# Patient Record
Sex: Female | Born: 1985 | Race: White | Hispanic: No | Marital: Married | Smoking: Never smoker
Health system: Southern US, Community
[De-identification: ages and names within clinical notes are randomized; demographics above are authoritative.]

## PROBLEM LIST (undated history)

## (undated) DIAGNOSIS — E785 Hyperlipidemia, unspecified: Secondary | ICD-10-CM

## (undated) DIAGNOSIS — T7840XA Allergy, unspecified, initial encounter: Secondary | ICD-10-CM

## (undated) DIAGNOSIS — Z9289 Personal history of other medical treatment: Secondary | ICD-10-CM

## (undated) DIAGNOSIS — E7800 Pure hypercholesterolemia, unspecified: Secondary | ICD-10-CM

## (undated) DIAGNOSIS — E669 Obesity, unspecified: Secondary | ICD-10-CM

## (undated) HISTORY — PX: OTHER SURGICAL HISTORY: SHX169

## (undated) HISTORY — PX: WISDOM TOOTH EXTRACTION: SHX21

## (undated) HISTORY — DX: Obesity, unspecified: E66.9

## (undated) HISTORY — DX: Hyperlipidemia, unspecified: E78.5

## (undated) HISTORY — DX: Personal history of other medical treatment: Z92.89

## (undated) HISTORY — DX: Pure hypercholesterolemia, unspecified: E78.00

## (undated) HISTORY — DX: Allergy, unspecified, initial encounter: T78.40XA

## (undated) SURGERY — DILATION AND CURETTAGE
Anesthesia: General

---

## 2005-09-12 ENCOUNTER — Other Ambulatory Visit: Admission: RE | Admit: 2005-09-12 | Discharge: 2005-09-12 | Payer: Self-pay | Admitting: Obstetrics and Gynecology

## 2006-02-20 ENCOUNTER — Ambulatory Visit: Payer: Self-pay | Admitting: Family Medicine

## 2006-11-09 ENCOUNTER — Other Ambulatory Visit: Admission: RE | Admit: 2006-11-09 | Discharge: 2006-11-09 | Payer: Self-pay | Admitting: Obstetrics and Gynecology

## 2007-07-01 ENCOUNTER — Ambulatory Visit: Payer: Self-pay | Admitting: Family Medicine

## 2008-03-03 ENCOUNTER — Encounter: Payer: Self-pay | Admitting: Obstetrics & Gynecology

## 2008-03-03 ENCOUNTER — Ambulatory Visit: Payer: Self-pay | Admitting: Obstetrics & Gynecology

## 2008-03-23 ENCOUNTER — Ambulatory Visit: Payer: Self-pay | Admitting: Family Medicine

## 2008-03-23 DIAGNOSIS — E669 Obesity, unspecified: Secondary | ICD-10-CM | POA: Insufficient documentation

## 2008-07-10 HISTORY — PX: EYE SURGERY: SHX253

## 2009-05-06 ENCOUNTER — Encounter: Payer: Self-pay | Admitting: Obstetrics and Gynecology

## 2009-05-06 ENCOUNTER — Ambulatory Visit: Payer: Self-pay | Admitting: Obstetrics and Gynecology

## 2009-05-06 LAB — CONVERTED CEMR LAB
Clue Cells Wet Prep HPF POC: NONE SEEN
Trich, Wet Prep: NONE SEEN

## 2010-04-07 ENCOUNTER — Ambulatory Visit: Payer: Self-pay | Admitting: Obstetrics & Gynecology

## 2010-04-21 ENCOUNTER — Encounter: Payer: Self-pay | Admitting: Obstetrics and Gynecology

## 2010-04-21 ENCOUNTER — Ambulatory Visit: Payer: Self-pay | Admitting: Obstetrics & Gynecology

## 2010-06-07 LAB — CONVERTED CEMR LAB
Cholesterol: 269 mg/dL — ABNORMAL HIGH (ref 0–200)
HDL: 49 mg/dL (ref >39)
LDL Cholesterol: 160 mg/dL — ABNORMAL HIGH (ref 0–99)
Triglycerides: 299 mg/dL — ABNORMAL HIGH (ref <150)
VLDL: 60 mg/dL — ABNORMAL HIGH (ref 0–40)

## 2010-06-10 ENCOUNTER — Ambulatory Visit: Payer: Self-pay | Admitting: Family Medicine

## 2010-06-10 DIAGNOSIS — E7800 Pure hypercholesterolemia, unspecified: Secondary | ICD-10-CM | POA: Insufficient documentation

## 2010-06-10 DIAGNOSIS — E785 Hyperlipidemia, unspecified: Secondary | ICD-10-CM

## 2010-08-09 NOTE — Assessment & Plan Note (Signed)
Summary: DISCUSS LABS FROM DR LEGGETT/CLE   Vital Signs:  Patient profile:   25 year old female Height:      63 inches Weight:      172 pounds BMI:     30.58 Temp:     98.2 degrees F oral Pulse rate:   88 / minute Pulse rhythm:   regular BP sitting:   114 / 68  (left arm) Cuff size:   regular  Vitals Entered By: Lewanda Rife LPN (June 10, 2010 8:09 AM) CC: Discuss labs from Dr Penne Lash   History of Present Illness: here to disc labs -- high cholesterol   lipids are high from draw in oct with tirg 299 and HDL of 49 and LDL 160 has had high chol in the past  diet is fair currently - not optimal   lost wt for her wedding and then gained it back now exercise is more sporatic -- tries to go to gym 2-3 times per wk cardio and strength training   eating is not optimal  beef -- 2 times per week  does eat fried foods 3-4 days per week  fast food 1-2 times per week  high fat dairly -- cheese is the number one  no shellfish -- very rare  some eggs - mayo 2 times per week  [place] breadfast one time per week        Allergies: 1)  ! * Oxycontin  Past History:  Family History: Last updated: 06/10/2010 GF with a cva ?  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  no heart disease and no high chol  no cancer in family  Review of Systems General:  Denies fatigue, fever, loss of appetite, and malaise. Eyes:  Denies blurring and eye irritation. CV:  Denies chest pain or discomfort, palpitations, and shortness of breath with exertion. Resp:  Denies cough, shortness of breath, and wheezing. GI:  Denies abdominal pain, change in bowel habits, and indigestion. GU:  Denies dysuria and urinary  frequency. MS:  Denies joint pain, joint redness, joint swelling, muscle aches, and cramps. Derm:  Denies itching, lesion(s), poor wound healing, and rash. Neuro:  Denies numbness and tingling. Psych:  Denies anxiety and depression. Endo:  Denies cold intolerance, excessive thirst, excessive urination, and heat intolerance. Heme:  Denies abnormal bruising and bleeding.  Physical Exam  General:  overweight but generally well appearing  Head:  Normocephalic and atraumatic without obvious abnormalities. No apparent alopecia or balding. Mouth:  enlarged tonsils, no exudate rapid strep test + Neck:  supple with full rom and no masses or thyromegally, no JVD or carotid bruit  Lungs:  Normal respiratory effort, chest expands symmetrically. Lungs are clear to auscultation, no crackles or wheezes. Heart:  Normal rate and regular rhythm. S1 and S2 normal without gallop, murmur, click, rub or other extra sounds. Extremities:  no CCE  Skin:  Intact without suspicious lesions or rashes Cervical Nodes:  No lymphadenopathy noted Psych:  normal affect, talkative and pleasant    Impression & Recommendations:  Problem # 1:  HYPERLIPIDEMIA (ICD-272.4) Assessment New  with obesity and hypertriglyceridemia and no fam hx rev labs in detail with pt  diet is quite poor long disc  of low sat fat diet  will also inc exercise to 5 d per week re check lab in 3 mo -- if not imp will consider statin    HDL:49 (04/21/2010)  LDL:160 (04/21/2010)  Chol:269 (04/21/2010)  Trig:299 (04/21/2010)  Complete Medication List: 1)  Junel Fe 1.5/30 1.5-30 Mg-mcg Tabs (Norethin ace-eth estrad-fe) .... As directed  Patient Instructions: 1)  you can raise your HDL (good cholesterol) by increasing exercise and eating omega 3 fatty acid supplement like fish oil or flax seed oil over the counter 2)  you can lower LDL (bad cholesterol) by limiting saturated fats in diet like red meat, fried foods, egg yolks, fatty breakfast  meats, high fat dairy products and shellfish  3)  work on diet and exercise and slow weight loss  4)  schedule fasting labs in 3 months lipid/ast/alt 272    Orders Added: 1)  Est. Patient Level III [36644]    Current Allergies (reviewed today): ! * OXYCONTIN

## 2010-11-22 NOTE — Assessment & Plan Note (Signed)
NAME:  Roberta Coleman, Roberta Coleman NO.:  [id number]   MEDICAL RECORD NO.:  [id number]          PATIENT TYPE:  POB   LOCATION:  [facility] at [facility]         FACILITY:  [facility]   PHYSICIAN:  Argentina Donovan, MD        DATE OF BIRTH:  Dec 20, 1985   DATE OF SERVICE:  05/06/2009                                  CLINIC NOTE   .  The patient is a 25 year old Caucasian female, nulligravida, recently  married within the last 4 months.  She has been on birth control pills  for sometime and has been very happy with her prescription and she takes  Junel FE 1.5/30, 3 packages at the time.  She has no medical complaints.  She works for a [employer] and has for many years.   REVIEW OF SYSTEMS:  Negative with exception of fishy occasional vaginal  odor.  Wet prep was done last time that showed too numerous to count  white cells, but no sign of any clue cells.  With her description,  however, we were going to give her a prescription for Flagyl and do a  second another wet prep.  She is due in for her annual Pap smear which  will be done also today.  Review of systems were negative.  No  significant medical history.   ALLERGIES:  Only to OXYCODONE.   PHYSICAL EXAMINATION:  VITAL SIGNS:  Blood pressure is 116/74, pulse is  92 per minute, the patient weighs 149 pounds, 5 feet 3 inches tall.  GENERAL:  Well-developed, well-nourished white female, in no acute  distress.  HEENT:  Within normal limits.  PERRLA, normocephalic.  NECK:  Supple.  Thyroid symmetrical, no masses.  BACK:  Erect.  LUNGS:  Clear to auscultation and percussion.  HEART:  No murmur, normal sinus rhythm.  BREASTS:  Symmetrical, no masses.  No nipple discharge.  No  supraclavicular or axillary nodes.  ABDOMEN:  Soft, flat, nontender.  No masses or organomegaly.  EXTREMITIES:  No edema.  No varicosities.  DTRs within normal limits.  VAGINA:  The genitalia external is normal.  BUS within normal limits.  The vagina is clean  and well rugated.  The cervix is clean, nulliparous.  The uterus is anterior with normal size, shape, and consistency.  The  adnexa is normal.  Wet prep and Pap smear were taken.  RECTAL:  Deferred.   IMPRESSION:  Normal physical examination, renewal of the patient's birth  control pills, instructions given when she decides that she is going to  start her family to get on, prenatal vitamins with folic acid.           ______________________________  Argentina Donovan, MD    PR/MEDQ  D:  05/06/2009  T:  05/07/2009  Job:  [id number]

## 2010-11-22 NOTE — Assessment & Plan Note (Signed)
NAMERICHARD, HOLZ NO.:  [id number]   MEDICAL RECORD NO.:  [id number]          PATIENT TYPE:  POB   LOCATION:  [facility] at [facility]         FACILITY:  [facility]   PHYSICIAN:  Johnella Moloney, MD        DATE OF BIRTH:  [birth year]   DATE OF SERVICE:  03/03/2008                                  CLINIC NOTE   CHIEF COMPLAINT:  Annual examination, birth control consult.   HISTORY OF PRESENT ILLNESS:  The patient is a 25 year old gravida 0 with  last menstrual period on February 01, 2008, who is here for her annual exam.  The patient also wants to discuss birth control.  She has used the OCPs  in the past in the form of Junel.  I want to discuss other forms of  birth control.  The patient is currently sexually active with her  fiance.  They have been in a monogamous relationship for 5 years and has  no other gynecologic concerns.   Past OB/GYN history, G0, menarche at age 83.  Regular menstrual cycles  with 28 days between cycles.  Her periods last for 4 days with medium  flow and mild pain associated with her periods.  No bleeding in between  periods.  The patient is currently not taking any birth control pills,  but uses condoms for birth control and for sexually transmitted  infection prevention.  She has a history of normal Pap smears.  The last  one was in October 09, 2006, and denies any sexually transmitted  infections.   PAST MEDICAL HISTORY:  None.   PAST SURGICAL HISTORY:  Wisdom teeth removal in June 2007.   MEDICATIONS:  None.   ALLERGIES:  OxyContin, which causes itching.   SOCIAL HISTORY:  The patient lives with her fiance.  She is currently  employed.  She does not smoke, drink alcohol, or use any illicit drugs.  She denies any history of sexual or physical abuse.   REVIEW OF SYSTEMS:  The patient endorses occasional vaginal odor.  She  says that since October last year, she has noticed that her vaginal  discharge has a different odor and that she was not  used to in the past.  No pruritus or any other symptoms.   PHYSICAL EXAMINATION:  VITAL SIGNS:  Blood pressure 124/77, pulse 88,  weight 168 pounds, and height 5 feet 3-1/2 inches.  IN GENERAL:  In no apparent distress.  HEAD, EYES, EARS, NOSE, AND THROAT:  Normocephalic and atraumatic.  NECK:  Supple.  Normal thyroid.  No masses palpated.  BREASTS:  Symmetric in size.  No abnormal masses, skin changes,  drainage, or lymphadenopathy.  LUNGS:  Clear to auscultation bilaterally.  HEART:  Regular rate and rhythm.  ABDOMEN:  Soft, nontender, and nondistended.  EXTREMITIES:  No clubbing, cyanosis or edema.  PELVIC:  Normal external female genitalia.  Pink, well rugated vagina,  normal cervical contour.  No abnormal drainage.  Small amount of white  discharge noted in the vault.  A sample of this was taken for wet prep.  A cervical Pap smear was done on bimanual exam, small anteverted uterus.  No  tenderness on palpation, normal adnexa without tenderness.   ASSESSMENT/PLAN:  The patient is a 25 year old, G0, here for annual exam  and birth control consult also annual exam.  Pap smear was done today.  She has normal breast examination.  We will follow up the Pap smear  results.  We will also follow up the wet prep results.  Given the  patient's report of vaginal odor.  She was told that is unusual to have  a change in order as time progresses and as the cycle progresses;  however, proper vulvar hygiene habits were emphasized with her.  As for  contraception, counseling discussed the various forms of contraception  that are available including all hormonal contraception modalities and  condoms.  The patient is interested in continuing with oral  contraceptive pills.  She is not interested in continuing it in a  continuous manner and having 4 periods a year, she was given a  prescription for Junel Fe 1.50/30 three months' supply with 5 refills.  The patient was also told to call for any  concerning side effects or any  other gynecologic concerns.           ______________________________  Johnella Moloney, MD     UD/MEDQ  D:  03/03/2008  T:  03/04/2008  Job:  [id number]

## 2010-11-22 NOTE — Assessment & Plan Note (Signed)
NAME:  Roberta Coleman, Roberta Coleman NO.:  [id number]   MEDICAL RECORD NO.:  [id number]          PATIENT TYPE:  POB   LOCATION:  [facility] at [facility]         FACILITY:  [facility]   PHYSICIAN:  Allie Bossier, MD        DATE OF BIRTH:  Apr 25, 1986   DATE OF SERVICE:  04/07/2010                                  CLINIC NOTE   HISTORY OF PRESENT ILLNESS:  Roberta Coleman is a 25 year old married  white, gravida 0, who has been married for about a year.  She comes in  here for her annual exam.  She is happy with her Junel FE 1.5/30 birth  control pills that she uses in a continuous manner (withdrawal bleed  every 3 months).  She does not think she wants any children for the next  5 years.   REVIEW OF SYSTEMS:  She works at [employer] in the [employer].  She denies dyspareunia.  The remainder of her review of  systems and questions are negative.   PAST MEDICAL HISTORY:  She has a history of having untreated elevated  cholesterol.   PAST SURGICAL HISTORY:  She had Lasix surgery in 2010 and she had a  wisdom teeth extraction in the past.   FAMILY HISTORY:  Negative for breast, GYN, and colon malignancies.   SOCIAL HISTORY:  Negative for tobacco or illegal drug use and she drinks  socially.   MEDICATIONS:  Junel birth control pills daily.   ALLERGIES:  OXYCONTIN.  She has no latex allergy.   PHYSICAL EXAMINATION:  GENERAL:  Well-nourished, well-hydrated pleasant  white female.  VITAL SIGNS:  Height 5 feet 3-1/2 inches, weight 166 (this is up 15  pounds from her annual exam last year), blood pressure 132/70, pulse 92.  HEENT:  Normal.  BREASTS:  Normal bilaterally.  HEART:  Regular rate and rhythm.  LUNGS:  Clear sensation bilaterally.  ABDOMEN:  No palpable hepatosplenomegaly.  EXTERNAL GENITALIA:  Shaved.  No lesions.  CERVIX:  She is expected normal-appearing ectropion.  She has normal  discharge.  Uterus is midplane, relatively nonmobile.  Her adnexa are  nontender, no masses, and her uterine exam is without tenderness.   ASSESSMENT AND PLAN:  1. Annual exam.  I have checked Pap smear.  Recommended self-breast      and self-vulvar exams monthly.  2. Weight gain in last year.  She thinks this is due to activity and      diet changes, but I will check a TSH today.  3. History of elevated cholesterol, I am checking fasting lipids      today.      Allie Bossier, MD     MCD/MEDQ  D:  04/07/2010  T:  04/07/2010  Job:  [id number]

## 2010-12-29 ENCOUNTER — Encounter: Payer: Self-pay | Admitting: Family Medicine

## 2010-12-29 ENCOUNTER — Ambulatory Visit (INDEPENDENT_AMBULATORY_CARE_PROVIDER_SITE_OTHER): Payer: PRIVATE HEALTH INSURANCE | Admitting: Family Medicine

## 2010-12-29 VITALS — BP 110/70 | HR 70 | Temp 99.2°F | Wt 171.0 lb

## 2010-12-29 DIAGNOSIS — R509 Fever, unspecified: Secondary | ICD-10-CM

## 2010-12-29 DIAGNOSIS — J069 Acute upper respiratory infection, unspecified: Secondary | ICD-10-CM

## 2010-12-29 NOTE — Progress Notes (Signed)
25 yo with nonspecific symptoms for past two days. Fatigue, mildly sore throat, headache and fever Tmax 100.7 Feels better with ibuprofen.  No abdominal pain, rash, nausea or vomiting. Around multiple children this week.  Patient Active Problem List  Diagnoses  . HYPERLIPIDEMIA  . OBESITY   Past Medical History  Diagnosis Date  . Obesity   . Hyperlipidemia    No past surgical history on file. History  Substance Use Topics  . Smoking status: Never Smoker   . Smokeless tobacco: Not on file  . Alcohol Use: Yes   No family history on file. Allergies  Allergen Reactions  . Oxycodone Hcl    No current outpatient prescriptions on file prior to visit.   The PMH, PSH, Social History, Family History, Medications, and allergies have been reviewed in [facility], and have been updated if relevant.  ROS: See HPI No CP, SOB, cough.  Physical exam: BP 110/70  Pulse 70  Temp(Src) 99.2 F (37.3 C) (Oral)  Wt 171 lb (77.565 kg) Gen:  Alert, pleasant, NAd HEENT: supple, no adenopathy Mild pharyngeal erythema No exudate Resp:  CTA bilaterally Skin:  No rash  1. Fever  POCT rapid strep A   Likely viral. Rapid strep neg. Continue supportive care with prn Ibuprofen. RTC if no improvement in 5-7 days. The patient indicates understanding of these issues and agrees with the plan.

## 2011-07-05 ENCOUNTER — Ambulatory Visit: Payer: PRIVATE HEALTH INSURANCE | Admitting: Obstetrics & Gynecology

## 2011-07-13 ENCOUNTER — Ambulatory Visit (INDEPENDENT_AMBULATORY_CARE_PROVIDER_SITE_OTHER): Payer: BC Managed Care – PPO | Admitting: Obstetrics & Gynecology

## 2011-07-13 ENCOUNTER — Encounter: Payer: Self-pay | Admitting: Obstetrics & Gynecology

## 2011-07-13 DIAGNOSIS — E785 Hyperlipidemia, unspecified: Secondary | ICD-10-CM

## 2011-07-13 DIAGNOSIS — Z0000 Encounter for general adult medical examination without abnormal findings: Secondary | ICD-10-CM

## 2011-07-13 DIAGNOSIS — Z113 Encounter for screening for infections with a predominantly sexual mode of transmission: Secondary | ICD-10-CM

## 2011-07-13 DIAGNOSIS — Z1272 Encounter for screening for malignant neoplasm of vagina: Secondary | ICD-10-CM

## 2011-07-13 LAB — LIPID PANEL
HDL: 40 mg/dL (ref >39)
LDL Cholesterol: 127 mg/dL — ABNORMAL HIGH (ref 0–99)
Triglycerides: 395 mg/dL — ABNORMAL HIGH (ref <150)

## 2011-07-13 LAB — TSH: TSH: 2.231 u[IU]/mL (ref 0.350–4.500)

## 2011-07-13 NOTE — Progress Notes (Addendum)
Subjective:    Roberta Coleman is a 26 y.o. female who presents for an annual exam. She complains of decreased libido and is considering changing birth control methods. The patient is sexually active. GYN screening history: last pap: was normal. The patient wears seatbelts: yes. The patient participates in regular exercise: yes. (Insanity program). Has the patient ever been transfused or tattooed?: no. The patient reports that there is not domestic violence in her life.   Menstrual History: OB History    Grav Para Term Preterm Abortions TAB SAB Ect Mult Living   0               Menarche age: 39 Patient's last menstrual period was 07/08/2011.    The following portions of the patient's history were reviewed and updated as appropriate: allergies, current medications, past family history, past medical history, past social history, past surgical history and problem list.  Review of Systems A comprehensive review of systems was negative. She has been married for about 2 1/2 years. She denies dysparunia. She works at the [employer], declines the flu shot.   Objective:    BP 122/78  Pulse 89  Ht 5\' 3"  (1.6 m)  Wt 180 lb (81.647 kg)  BMI 31.89 kg/m2  LMP 07/08/2011  General Appearance:    Alert, cooperative, no distress, appears stated age  Head:    Normocephalic, without obvious abnormality, atraumatic  Eyes:    PERRL, conjunctiva/corneas clear, EOM's intact, fundi    benign, both eyes  Ears:    Normal TM's and external ear canals, both ears  Nose:   Nares normal, septum midline, mucosa normal, no drainage    or sinus tenderness  Throat:   Lips, mucosa, and tongue normal; teeth and gums normal  Neck:   Supple, symmetrical, trachea midline, no adenopathy;    thyroid:  no enlargement/tenderness/nodules; no carotid   bruit or JVD  Back:     Symmetric, no curvature, ROM normal, no CVA tenderness  Lungs:     Clear to auscultation bilaterally, respirations unlabored  Chest  Wall:    No tenderness or deformity   Heart:    Regular rate and rhythm, S1 and S2 normal, no murmur, rub   or gallop  Breast Exam:    No tenderness, masses, or nipple abnormality  Abdomen:     Soft, non-tender, bowel sounds active all four quadrants,    no masses, no organomegaly  Genitalia:    Normal female without lesion, discharge or tenderness, NSS mid plane, minimally mobile, no adnexal masses or tenderness     Extremities:   Extremities normal, atraumatic, no cyanosis or edema  Pulses:   2+ and symmetric all extremities  Skin:   Skin color, texture, turgor normal, no rashes or lesions  Lymph nodes:   Cervical, supraclavicular, and axillary nodes normal  Neurologic:   CNII-XII intact, normal strength, sensation and reflexes    throughout  .    Assessment:    Healthy female exam.    Plan:     Thin prep Pap smear.  I have recommended weight loss and I will recheck her lipids today.

## 2011-07-13 NOTE — Progress Notes (Signed)
Addended by: Allie Bossier on: 07/13/2011 03:47 PM   Modules accepted: Kipp Brood

## 2011-07-17 ENCOUNTER — Other Ambulatory Visit: Payer: Self-pay | Admitting: Obstetrics & Gynecology

## 2011-07-17 MED ORDER — MISOPROSTOL 200 MCG PO TABS: ORAL_TABLET | ORAL | Status: DC

## 2011-07-18 ENCOUNTER — Ambulatory Visit (INDEPENDENT_AMBULATORY_CARE_PROVIDER_SITE_OTHER): Payer: BC Managed Care – PPO | Admitting: Nurse Practitioner

## 2011-07-18 ENCOUNTER — Encounter: Payer: Self-pay | Admitting: Nurse Practitioner

## 2011-07-18 VITALS — BP 124/83 | HR 76 | Wt 178.0 lb

## 2011-07-18 DIAGNOSIS — N946 Dysmenorrhea, unspecified: Secondary | ICD-10-CM

## 2011-07-18 DIAGNOSIS — Z309 Encounter for contraceptive management, unspecified: Secondary | ICD-10-CM

## 2011-07-18 DIAGNOSIS — Z30430 Encounter for insertion of intrauterine contraceptive device: Secondary | ICD-10-CM

## 2011-07-18 MED ORDER — IBUPROFEN 800 MG PO TABS: 800.0000 mg | ORAL_TABLET | Freq: Three times a day (TID) | ORAL | Status: AC | PRN

## 2011-07-18 NOTE — Patient Instructions (Signed)

## 2011-07-18 NOTE — Progress Notes (Signed)
IUD Insertion Procedure Note   Pre-operative Diagnosis: contraception  Post-operative Diagnosis: same  Indications: contraception  Procedure Details  Urine pregnancy test was done  and result was negative.  The risks (including infection, bleeding, pain, and uterine perforation) and benefits of the procedure were explained to the patient and Written informed consent was obtained.    Cervix cleansed with Betadine. Uterus sounded to 6 cm. IUD inserted without difficulty. String visible and trimmed. Patient tolerated procedure well.  IUD Information: Mirena, Lot # [id number] expires  6/15.  Condition: Stable some cramping Complications: None  Plan:  The patient was advised to call for any fever or for prolonged or severe pain or bleeding. She was advised to use NSAID as needed for mild to moderate pain. She was advised to use condoms for 1-2 weeks  Attending Physician Documentation: I was present for or participated in the entire procedure, including opening and closing.

## 2011-07-20 ENCOUNTER — Ambulatory Visit: Payer: BC Managed Care – PPO | Admitting: Obstetrics & Gynecology

## 2011-08-15 ENCOUNTER — Ambulatory Visit: Payer: BC Managed Care – PPO | Admitting: Family Medicine

## 2014-05-13 ENCOUNTER — Inpatient Hospital Stay: Payer: Self-pay | Admitting: Obstetrics and Gynecology

## 2014-05-13 LAB — CBC WITH DIFFERENTIAL/PLATELET
BASOS ABS: 0 10*3/uL (ref 0.0–0.1)
BASOS PCT: 0.4 %
EOS ABS: 0.1 10*3/uL (ref 0.0–0.7)
EOS PCT: 0.9 %
HCT: 39.9 % (ref 35.0–47.0)
HGB: 13.6 g/dL (ref 12.0–16.0)
LYMPHS ABS: 2.7 10*3/uL (ref 1.0–3.6)
Lymphocyte %: 24.3 %
MCH: 30.1 pg (ref 26.0–34.0)
MCHC: 34 g/dL (ref 32.0–36.0)
MCV: 88 fL (ref 80–100)
MONO ABS: 1 x10 3/mm — AB (ref 0.2–0.9)
MONOS PCT: 8.6 %
NEUTROS ABS: 7.4 10*3/uL — AB (ref 1.4–6.5)
NEUTROS PCT: 65.8 %
PLATELETS: 182 10*3/uL (ref 150–440)
RBC: 4.52 10*6/uL (ref 3.80–5.20)
RDW: 14.9 % — ABNORMAL HIGH (ref 11.5–14.5)
WBC: 11.3 10*3/uL — AB (ref 3.6–11.0)

## 2014-05-14 LAB — HEMATOCRIT: HCT: 32.8 % — ABNORMAL LOW (ref 35.0–47.0)

## 2014-10-31 NOTE — Op Note (Signed)
PATIENT NAME:  Roberta Coleman, Roberta Coleman MR#:  [id number] DATE OF BIRTH:  [birth year]  DATE OF PROCEDURE:  05/13/2014  PREOPERATIVE DIAGNOSES:  1.  Intrauterine pregnancy at 40 weeks and 0 days.  2.  Arrest of dilation at 9 cm x 6 hours.  POSTOPERATIVE DIAGNOSES: 1.  Intrauterine pregnancy at 40 weeks and 0 days.  2.  Arrest of dilation at 9 cm x 6 hours.  PROCEDURE:  Primary low transverse cesarean section via Pfannenstiel skin incision with double layer uterine closure.   SURGEON: [place] Bingharlie Lucyann Romano, MD  ASSISTANT: Colleen L. Sharen HonesGutierrez, CNM  ANESTHESIA: Epidural.   ESTIMATED BLOOD LOSS: 700 mL   URINE OUTPUT: 450 mL via indwelling Foley catheter.   INTRAVENOUS FLUIDS: 1200 mL crystalloid.   ANTIBIOTICS: 2 g of Ancef given preoperatively.   VTE PROPHYLAXIS: SCDs to bilateral lower extremities  COMPLICATIONS: None.   SPECIMENS: None.  DISPOSITION: Stable to PACU.   FINDINGS: No intra-abdominal adhesions were noted. Female infant in cephalic presentation, occiput transverse, with birth weight of 3655 g. Apgars of 9 and 9 at one and five minutes. Clear amniotic fluid. Grossly normal uterus, tubes, and ovaries bilaterally.   DESCRIPTION OF PROCEDURE: The patient was taken to the Operating Room where anesthesia was administered. She was prepped and draped in normal sterile fashion in the dorsal supine position with a leftward tilt. A Pfannenstiel skin incision was made with a scalpel and carried through to the underlying fascia. The rectus fascia was then incised in the midline. This was extended laterally with the Mayo scissors. Attention was then turned to the superior aspect of the fascial incision, which was grasped with a Kocher clamp x 2, tented up, and the rectus muscles dissected off with the Bovie. In a similar fashion, the inferior aspect of the fascial incision was grasped with the Kocher clamps x 2, and tented up and dissected off the rectus muscles were the Mayo scissors. The rectus  muscles were then separated in the midline, and the peritoneum was entered bluntly. The bladder blade was then inserted and the vesicouterine peritoneum was identified, tented up, and the bladder flap was created with the Metzenbaum scissors. The bladder blade was then reinserted.   A low transverse hysterotomy was made with the scalpel until the endometrial cavity was breached, yielding clear amniotic fluid. The incision was then extended bluntly, and the infant's  head was delivered atraumatically, as well as the remainder of the body. The cord was then clamped x 2 and cut, and the infant was handed to the awaiting pediatricians. Next, the placenta was then gradually expressed from the  uterus, and the uterus was then exteriorized, cleared of all clots and debris. The hysterotomy was repaired with a runny suture of 1-0 Monocryl, and a second imbricating layer of 1-0 Monocryl was then placed for excellent hemostasis.   The uterus and adnexa were then returned to the abdomen. The hysterotomy was reinspected and excellent hemostasis was noted. Next, the fascia was then reapproximated with 0 Vicryl in a simple running fashion bilaterally, and the subcutaneous layer was then reapproximated with 2-0 plain gut. The skin was closed with 4-0 Monocryl. Sponge, lap, needle, and instrument counts were correct x 2, and the patient was taken to the recovery room awake, alert, breathing independently, in stable condition.   At the end of the case, as prophylaxis against  postpartum hemorrhage, 1000 mcg of misoprostol was placed per rectum.    ____________________________ [place] Bingharlie Orly Quimby, MD cp:MT D: 05/16/2014 14:37:00 ET T: 05/16/2014  14:57:56 ET JOB#: [id number]  cc: Rivereno Bing, MD, <Dictator> Peterson Bing MD ELECTRONICALLY SIGNED 05/18/2014 7:45

## 2014-11-17 NOTE — H&P (Signed)
L&D Evaluation:  History:  HPI 29 year old G1 at 8478w0d by D=8wk US derived EDC of 05/13/2014 presenting with gross SROM clear.  +FM, +ctx, no VB.  [facility]   Presents with contractions, leaking fluid   Patient's Medical History hypercholesterolemia   Patient's Surgical History wisdome teeth extraction   Medications Pre Natal Vitamins  fishoil, PNV   Allergies oxycontin   Social History none   Family History Non-Contributory   Exam:  Vital Signs stable   Urine Protein not completed   General painfull contracting   Mental Status clear   Chest clear   Heart normal sinus rhythm   Abdomen gravid, tender with contractions   Estimated Fetal Weight Average for gestational age   Back no CVAT   Edema no edema   Pelvic 6cm per nursing staff and grossly ruptured   Mebranes Ruptured   Description clear   FHT normal rate with no decels, 125. moderate, no accels, occasional variable   Ucx regular, q575min   Skin dry, no lesions   Impression:  Impression active labor, 378w0d term labor with SROM   Plan:  Comments 1) Labor - expectant managment  2) Fetus - category I tracing - 39lbs weight gain this pregnancy  3) PNL B positive / ABSC neg / RI / VZI / HBsAg neg / HIV neg / RPR NR / 1st trimester & MSAFP neg & neg / 1-hr 104 / GBS negative   4) TDAP received 03/06/2014 offer influenza vaccination  5) Breast feeding   6) Dispostion - pending delivery anticipate vaginal   Electronic Signatures: Lorrene ReidStaebler, Merary Garguilo M (MD)  (Signed [phone]-15 06:11)  Authored: L&D Evaluation   Last Updated: 04-Nov-15 06:11 by Lorrene ReidStaebler, Sari Cogan M (MD)

## 2015-07-11 DIAGNOSIS — Z9289 Personal history of other medical treatment: Secondary | ICD-10-CM

## 2015-07-11 HISTORY — DX: Personal history of other medical treatment: Z92.89

## 2016-01-20 ENCOUNTER — Encounter: Payer: Self-pay | Admitting: Emergency Medicine

## 2016-01-20 ENCOUNTER — Observation Stay
Admission: EM | Admit: 2016-01-20 | Discharge: 2016-01-21 | Disposition: A | Discharge status: Home / Self Care | Payer: BLUE CROSS/BLUE SHIELD | Attending: Obstetrics and Gynecology | Admitting: Obstetrics and Gynecology

## 2016-01-20 ENCOUNTER — Emergency Department: Payer: BLUE CROSS/BLUE SHIELD

## 2016-01-20 DIAGNOSIS — E7800 Pure hypercholesterolemia, unspecified: Secondary | ICD-10-CM | POA: Insufficient documentation

## 2016-01-20 DIAGNOSIS — Z3A09 9 weeks gestation of pregnancy: Secondary | ICD-10-CM | POA: Diagnosis not present

## 2016-01-20 DIAGNOSIS — Z6831 Body mass index (BMI) 31.0-31.9, adult: Secondary | ICD-10-CM | POA: Diagnosis not present

## 2016-01-20 DIAGNOSIS — O200 Threatened abortion: Secondary | ICD-10-CM | POA: Diagnosis present

## 2016-01-20 DIAGNOSIS — O99011 Anemia complicating pregnancy, first trimester: Secondary | ICD-10-CM | POA: Diagnosis not present

## 2016-01-20 DIAGNOSIS — D62 Acute posthemorrhagic anemia: Secondary | ICD-10-CM | POA: Diagnosis not present

## 2016-01-20 DIAGNOSIS — O99211 Obesity complicating pregnancy, first trimester: Secondary | ICD-10-CM | POA: Diagnosis not present

## 2016-01-20 DIAGNOSIS — O039 Complete or unspecified spontaneous abortion without complication: Secondary | ICD-10-CM | POA: Diagnosis present

## 2016-01-20 DIAGNOSIS — Z9889 Other specified postprocedural states: Secondary | ICD-10-CM

## 2016-01-20 DIAGNOSIS — O209 Hemorrhage in early pregnancy, unspecified: Secondary | ICD-10-CM

## 2016-01-20 DIAGNOSIS — O034 Incomplete spontaneous abortion without complication: Secondary | ICD-10-CM

## 2016-01-20 LAB — CBC WITH DIFFERENTIAL/PLATELET
BASOS ABS: 0.1 10*3/uL (ref 0–0.1)
Basophils Relative: 1 %
EOS ABS: 0.2 10*3/uL (ref 0–0.7)
EOS PCT: 2 %
HCT: 40.5 % (ref 35.0–47.0)
Hemoglobin: 13.8 g/dL (ref 12.0–16.0)
LYMPHS PCT: 22 %
Lymphs Abs: 2.4 10*3/uL (ref 1.0–3.6)
MCH: 30.1 pg (ref 26.0–34.0)
MCHC: 34 g/dL (ref 32.0–36.0)
MCV: 88.6 fL (ref 80.0–100.0)
Monocytes Absolute: 1 10*3/uL — ABNORMAL HIGH (ref 0.2–0.9)
Monocytes Relative: 9 %
NEUTROS PCT: 66 %
Neutro Abs: 7.5 10*3/uL — ABNORMAL HIGH (ref 1.4–6.5)
PLATELETS: 261 10*3/uL (ref 150–440)
RBC: 4.57 MIL/uL (ref 3.80–5.20)
RDW: 13.3 % (ref 11.5–14.5)
WBC: 11.2 10*3/uL — AB (ref 3.6–11.0)

## 2016-01-20 LAB — PROTIME-INR
INR: 0.93
Prothrombin Time: 12.7 seconds (ref 11.4–15.0)

## 2016-01-20 LAB — CBC
HEMATOCRIT: 30.3 % — AB (ref 35.0–47.0)
HEMOGLOBIN: 10.6 g/dL — AB (ref 12.0–16.0)
MCH: 30.9 pg (ref 26.0–34.0)
MCHC: 35 g/dL (ref 32.0–36.0)
MCV: 88.4 fL (ref 80.0–100.0)
Platelets: 217 10*3/uL (ref 150–440)
RBC: 3.43 MIL/uL — ABNORMAL LOW (ref 3.80–5.20)
RDW: 12.6 % (ref 11.5–14.5)
WBC: 9.4 10*3/uL (ref 3.6–11.0)

## 2016-01-20 LAB — APTT: aPTT: 26 seconds (ref 24–36)

## 2016-01-20 LAB — HCG, QUANTITATIVE, PREGNANCY: hCG, Beta Chain, Quant, S: 109805 m[IU]/mL — ABNORMAL HIGH (ref <5)

## 2016-01-20 MED ORDER — SODIUM CHLORIDE 0.9 % IV BOLUS (SEPSIS)
1000.0000 mL | Freq: Once | INTRAVENOUS | Status: AC
  Administered 2016-01-20: 1000 mL via INTRAVENOUS

## 2016-01-20 MED ORDER — LACTATED RINGERS IV SOLN
INTRAVENOUS | Status: DC
  Administered 2016-01-21: 1000 mL/h via INTRAVENOUS
  Administered 2016-01-21: 02:00:00 via INTRAVENOUS

## 2016-01-20 MED ORDER — SODIUM CHLORIDE 0.9 % IV BOLUS (SEPSIS)
2000.0000 mL | Freq: Once | INTRAVENOUS | Status: AC
  Administered 2016-01-20: 2000 mL via INTRAVENOUS

## 2016-01-20 NOTE — ED Notes (Signed)
Pt presents to ED with reports of vaginal bleeding that began today at 1700. Pt reports sitting on the toilet and passing quarter to baseball size clots. Pt states she is [redacted] weeks pregnant and this is her second pregnancy. Pt reports mild vaginal pressure and cramping.

## 2016-01-20 NOTE — Progress Notes (Signed)
At bedside for ultrasound [facility]  Patient manually checked an external os dilated about 1.5cm, unable to get into internal cervical os.  Still with moderate bleeding.  Given viable IUP and desired pregnancy will admit for overnight observation to monitor bleeding.  If increase in bleeding or patient proceeds to pass pregnancy this will allow prompt evaluation or intervention.  I discussed that diagnosis remains guarded given amount of bleeding that she presented with.

## 2016-01-20 NOTE — ED Notes (Signed)
Called to pt room because she felt like she had passed the large blood clot "? Placenta" - when assessed pt noted that the clot had passed - Dr Scotty CourtStafford to evaluate pt prior to cleaning up clotted material - VS stable and pt skin cool and dry

## 2016-01-20 NOTE — ED Notes (Signed)
Transvaginal US to be performed at bedside due to the fact that pt BP is unstable

## 2016-01-20 NOTE — ED Provider Notes (Signed)
[facility] Emergency Department Provider Note  ____________________________________________  Time seen: 7:10 PM  I have reviewed the triage vital signs and the nursing notes.   HISTORY  Chief Complaint Vaginal Bleeding and Threatened Miscarriage    HPI Roberta Coleman is a 30 y.o. female who complains of vaginal bleeding and passing clots for the past2 or 3 hours. She is about [redacted] weeks pregnant by dates this is her second pregnancy and she is followed up with [facility] and Dr. Chauncey Cruel.  Denies dizziness shortness of breath chest pain or syncope. Has not noticed any tissue passage.     Past Medical History  Diagnosis Date  . Obesity   . Hyperlipidemia   . High cholesterol   . Allergy      Patient Active Problem List   Diagnosis Date Noted  . Threatened abortion 01/20/2016  . URI (upper respiratory infection) 12/29/2010  . HYPERLIPIDEMIA 06/10/2010  . OBESITY 03/23/2008     Past Surgical History  Procedure Laterality Date  . Wisdom tooth extraction    . Eye surgery  2010    LASIX  . Cesarean section       Current Outpatient Rx  Name  Route  Sig  Dispense  Refill  . misoprostol (CYTOTEC) 200 MCG tablet      Take 3 ( ) tablets tonight.   3 tablet   0   . norethindrone-ethinyl estradiol-iron (MICROGESTIN FE1.5/30) 1.5-30 MG-MCG tablet   Oral   Take 1 tablet by mouth daily. Reported on 01/20/2016            Allergies Oxycodone hcl   Family History  Problem Relation Age of Onset  . Hypertension Mother   . Hyperlipidemia Father     Social History Social History  Substance Use Topics  . Smoking status: Never Smoker   . Smokeless tobacco: None  . Alcohol Use: Yes     Comment: occasionally    Review of Systems  Cardiovascular:   No chest pain. Respiratory:   No dyspnea or cough. Gastrointestinal:   Positive mild pelvic cramping. No vomiting.  Genitourinary:   Negative for dysuria or difficulty  urinating.  10-point ROS otherwise negative.  ____________________________________________   PHYSICAL EXAM:  VITAL SIGNS: ED Triage Vitals  Enc Vitals Group     BP 01/20/16 1839 117/75 mmHg     Pulse Rate 01/20/16 1839 114     Resp 01/20/16 1839 18     Temp 01/20/16 1839 98.4 F (36.9 C)     Temp Source 01/20/16 1839 Oral     SpO2 01/20/16 1839 100 %     Weight 01/20/16 1839 180 lb (81.647 kg)     Height 01/20/16 1839  (1.6 m)     Head Cir --      Peak Flow --      Pain Score 01/20/16 1840 4     Pain Loc --      Pain Edu? --      Excl. in GC? --     Vital signs reviewed, nursing assessments reviewed.   Constitutional:   Alert and oriented. Ill-appearing Eyes:   No scleral icterus. No conjunctival pallor. PERRL. EOMI.  No nystagmus. ENT   Head:   Normocephalic and atraumatic.     Mouth/Throat:   MMM, no pharyngeal erythema. No peritonsillar mass.    Neck:   No stridor. No SubQ emphysema. No meningismus. Hematological/Lymphatic/Immunilogical:   No cervical lymphadenopathy. Cardiovascular:   Tachycardia heart rate 110.  Symmetric bilateral radial and DP pulses.  No murmurs.  Respiratory:   Normal respiratory effort without tachypnea nor retractions. Breath sounds are clear and equal bilaterally. No wheezes/rales/rhonchi. Gastrointestinal:   Soft and nontender. Non distended. There is no CVA tenderness.  No rebound, rigidity, or guarding. Genitourinary:   Performed with nurse at bedside, external exam just reveals lots of clotted blood perineum. Speculum exam reveals a large amount of fresh blood in the vaginal vault with brisk bleeding through the cervix. Unable to visualize cervical os. Musculoskeletal:   Nontender with normal range of motion in all extremities. No joint effusions.  No lower extremity tenderness.  No edema. Neurologic:   Normal speech and language.  CN 2-10 normal. Motor grossly intact. No gross focal neurologic deficits are appreciated.   Skin:    Skin is warm, dry and intact. Pale. Diaphoresis.  ____________________________________________    LABS (pertinent positives/negatives) (all labs ordered are listed, but only abnormal results are displayed) Labs Reviewed  HCG, QUANTITATIVE, PREGNANCY - Abnormal; Notable for the following:    hCG, Beta Francene FindersChain, Quant, S [id number] (*)    All other components within normal limits  CBC WITH DIFFERENTIAL/PLATELET - Abnormal; Notable for the following:    WBC 11.2 (*)    Neutro Abs 7.5 (*)    Monocytes Absolute 1.0 (*)    All other components within normal limits  CBC - Abnormal; Notable for the following:    RBC 3.43 (*)    Hemoglobin 10.6 (*)    HCT 30.3 (*)    All other components within normal limits  APTT  PROTIME-INR  TYPE AND SCREEN   ____________________________________________   EKG    ____________________________________________    RADIOLOGY  Pelvic ultrasound reveals single live IUP but low-lying in the lower uterine segment. Shortened cervix. Heart rate 144, 10 weeks 0 days by ultrasound crown-rump length.  ____________________________________________   PROCEDURES   ____________________________________________   INITIAL IMPRESSION / ASSESSMENT AND PLAN / ED COURSE  Pertinent labs & imaging results that were available during my care of the patient were reviewed by me and considered in my medical decision making (see chart for details).  Patient presents with vaginal bleeding which appears to be brisk. In mild hemorrhagic shock with tachycardia pallor or diaphoresis. She also had some episodes of low blood pressure but with vigorous IV fluids this was improved. Over 2 hour interval she had a 3 point drop in hemoglobin. Obstetrics was consulted and after their exam and ultrasound, they will admit the patient for monitoring of hemoglobin and vital signs. Tachycardia is improved, blood pressure has  stabilized.    ____________________________________________   FINAL CLINICAL IMPRESSION(S) / ED DIAGNOSES  Final diagnoses:  Threatened abortion in first trimester  Vaginal bleeding before [redacted] weeks gestation       Portions of this note were generated with dragon dictation software. Dictation errors may occur despite best attempts at proofreading.   Sharman CheekPhillip Nicholle Falzon, MD 01/20/16 2241

## 2016-01-20 NOTE — ED Notes (Signed)
Per Dr Chauncey CruelStabler to dispose of formalin with material/clots in it - Per MD no need for testing on this material since there is a viable baby in pt uterus - per the lab material and formalin to be disposed of in a biohazard bag with no special handling

## 2016-01-20 NOTE — Consult Note (Signed)
Obstetrics & Gynecology Consult H&P    Consulting Department: Emergency  Consulting Physician: Scotty Court  Consulting Question: Bleeding, early pregnancy   History of Present Illness: Patient is a 30 y.o. G2P1001 at 9 weeks 1 day gestation by 6 week [place] derived EDC of 08/23/2016 who began having heavy vaginal bleeding with passage of clots this evening around 1800.  She reports no inciting events, has not experienced any significant cramping with the bleeding.  Believes she may have passed the pregnancy when she passed a large clot but has continued to bleed.  She has not prior history of pregnancy and her G1 pregnancy is notable for delivery via LTCS for failure to progress.  She did have a likely vasovagal episode during her evaluation in the ER with a brief episode of hypotension.  Blood type is B positive.    Review of Systems:10 point review of systems  Past Medical History:  Past Medical History  Diagnosis Date  . Obesity   . Hyperlipidemia   . High cholesterol   . Allergy     Past Surgical History:  Past Surgical History  Procedure Laterality Date  . Wisdom tooth extraction    . Eye surgery  2010    LASIX  . Cesarean section      Gynecologic History:  No history of STI or abnormal paps  Obstetric History: G2P1001 LTCS x 1  Family History:  Family History  Problem Relation Age of Onset  . Hypertension Mother   . Hyperlipidemia Father     Social History:  Social History   Social History  . Marital Status: Married    Spouse Name: N/A  . Number of Children: N/A  . Years of Education: N/A   Occupational History  . [employer]    Social History Main Topics  . Smoking status: Never Smoker   . Smokeless tobacco: Not on file  . Alcohol Use: Yes     Comment: occasionally  . Drug Use: Not on file  . Sexual Activity:    Partners: Male    Birth Control/ Protection: Pill   Other Topics Concern  . Not on file   Social History Narrative   Interval  running-walks the dog         Family History: non-contributory  Allergies:  Allergies  Allergen Reactions  . Oxycodone Hcl Itching    Medications: Prior to Admission medications   Medication Sig Start Date End Date Taking? Authorizing Provider  misoprostol (CYTOTEC) 200 MCG tablet Take 3 ( ) tablets tonight. 07/17/11   Allie Bossier, MD  norethindrone-ethinyl estradiol-iron (MICROGESTIN FE1.5/30) 1.5-30 MG-MCG tablet Take 1 tablet by mouth daily. Reported on 01/20/2016    Historical Provider, MD    Physical Exam Vitals: Blood pressure 131/95, pulse 106, temperature 98.4 F (36.9 C), temperature source Oral, resp. rate 18, height 5\' 3"  (1.6 m), weight 81.647 kg (180 lb), last menstrual period 11/09/2015, SpO2 100 %. General: HEENT: NAD Pulmonary: no increased work of breathing Cardiovascular: Heart rate currently at 90 BPM  Abdomen: soft, non-tender, non-distended Genitourinary: about 150cc of blood was evacuated from the patient's vagina, the cervix is visualized and dilated about 2-3cm, no visible products of conception at the cervical os Extremities: no edema Neurologic: CN I-XII grossly intact Psychiatric: appropriate mood and affect  Labs: Results for orders placed or performed during the hospital encounter of 01/20/16 (from the past 72 hour(s))  hCG, quantitative, pregnancy     Status: Abnormal   Collection Time: 01/20/16  6:55  PM  Result Value Ref Range   hCG, Beta Chain, Quant, S [id number] (H) <5 mIU/mL    Comment:          GEST. AGE      CONC.  (mIU/mL)   <=1 WEEK        5 - 50     2 WEEKS       50 - 500     3 WEEKS       100 - 10,000     4 WEEKS     1,000 - 30,000     5 WEEKS     3,500 - 115,000   6-8 WEEKS     12,000 - 270,000    12 WEEKS     15,000 - 220,000        FEMALE AND NON-PREGNANT FEMALE:     LESS THAN 5 mIU/mL   APTT     Status: None   Collection Time: 01/20/16  6:55 PM  Result Value Ref Range   aPTT 26 24 - 36 seconds  Protime-INR     Status:  None   Collection Time: 01/20/16  6:55 PM  Result Value Ref Range   Prothrombin Time 12.7 11.4 - 15.0 seconds   INR 0.93   CBC with Differential/Platelet     Status: Abnormal   Collection Time: 01/20/16  6:55 PM  Result Value Ref Range   WBC 11.2 (H) 3.6 - 11.0 K/uL   RBC 4.57 3.80 - 5.20 MIL/uL   Hemoglobin 13.8 12.0 - 16.0 g/dL   HCT 16.140.5 09.635.0 - 04.547.0 %   MCV 88.6 80.0 - 100.0 fL   MCH 30.1 26.0 - 34.0 pg   MCHC 34.0 32.0 - 36.0 g/dL   RDW 40.913.3 81.111.5 - 91.414.5 %   Platelets 261 150 - 440 K/uL   Neutrophils Relative % 66 %   Neutro Abs 7.5 (H) 1.4 - 6.5 K/uL   Lymphocytes Relative 22 %   Lymphs Abs 2.4 1.0 - 3.6 K/uL   Monocytes Relative 9 %   Monocytes Absolute 1.0 (H) 0.2 - 0.9 K/uL   Eosinophils Relative 2 %   Eosinophils Absolute 0.2 0 - 0.7 K/uL   Basophils Relative 1 %   Basophils Absolute 0.1 0 - 0.1 K/uL  Type and screen [facility]     Status: None   Collection Time: 01/20/16  6:56 PM  Result Value Ref Range   ABO/RH(D) B POS    Antibody Screen NEG    Sample Expiration 01/23/2016     Imaging No results found.  Assessment: 30 y.o. G2P1001 with inevitable vs complete vs incomplete abortion  Plan: 1) Inevitable abortion - based on continued bleeding my concern is for inevitable abortion vs incomplete abortion.  The patient would like to avoid surgery if at all possible.  Will obtain TVUS to evalute uterine stripe and for any retained products of conception.  She has a good initial H&H on presentation to the ER I have ordered a stat repeat.  If she remains hemodynamically stable and there remain products to be passed she had the option of trying a dose of cytotec 800mcg buccal administration.  If she has passed and bleeding decreases appropriately then she may be discharged with methergine 0.2mg  tab po q8hrs x 3 days and outpatient follow up.  If her repeat CBC shows significant drop, bleeding does not subside, or she becomes hemodynamically stable my  recommendation is to proceed with D&C.  I  will follow up on her TVUS results to see if there are any remaining products of conception.  She is Rh positive and does not require rhogam  2) Disposition - pending repeat labs and ultrasound

## 2016-01-20 NOTE — ED Notes (Signed)
Called to room by pt - she had passed a large blood clot that appeared to be placenta material - called Dr Scotty CourtStafford to room and he assessed pt - Advised by MD to continue pushing when the urge arose and continue passing placenta material - Pt VS stable - skin cool and dry - A&O x4 - spouse at bedside

## 2016-01-20 NOTE — ED Notes (Addendum)
Called to pt room - pt stating she was going to die - pt skin clammy and pale - applied 2 L of O2 via nasal cannula - BP 55/33 - Dr Scotty CourtStafford paged to room stat and Lauren RN/Andrea RN in room - IV placed in lt wrist - 2 NACL bolus started at this time stat - 1 with pressure bag - pt was placed in [place] position - Dr Scotty CourtStafford performed pelvic exam and paged Dr Chauncey CruelStabler to eval pt - note for 2022

## 2016-01-20 NOTE — ED Notes (Signed)
Pt cleaned up and clots/? Placenta placed in formalin solution - Dr Chauncey CruelStabler at bedside and performed vaginal exam - large amount of fluid suctioned from vaginal cavity and several large bright red clots removed by MD - possibility of surgery discussed with pt - reassured pt to call for help if she felt sick in any way or a feeling of doom

## 2016-01-21 ENCOUNTER — Encounter
Admission: EM | Disposition: A | Discharge status: Home / Self Care | Payer: Self-pay | Source: Home / Self Care | Attending: Obstetrics and Gynecology

## 2016-01-21 ENCOUNTER — Inpatient Hospital Stay: Payer: BLUE CROSS/BLUE SHIELD | Admitting: Anesthesiology

## 2016-01-21 DIAGNOSIS — Z9889 Other specified postprocedural states: Secondary | ICD-10-CM

## 2016-01-21 HISTORY — PX: DILATION AND EVACUATION: SHX1459

## 2016-01-21 LAB — CBC
HCT: 22.4 % — ABNORMAL LOW (ref 35.0–47.0)
HCT: 25.2 % — ABNORMAL LOW (ref 35.0–47.0)
HEMATOCRIT: 24.7 % — AB (ref 35.0–47.0)
HEMOGLOBIN: 8.7 g/dL — AB (ref 12.0–16.0)
HEMOGLOBIN: 8.7 g/dL — AB (ref 12.0–16.0)
Hemoglobin: 7.7 g/dL — ABNORMAL LOW (ref 12.0–16.0)
MCH: 30.9 pg (ref 26.0–34.0)
MCH: 30.4 pg (ref 26.0–34.0)
MCH: 30.7 pg (ref 26.0–34.0)
MCHC: 35.2 g/dL (ref 32.0–36.0)
MCHC: 34.2 g/dL (ref 32.0–36.0)
MCHC: 34.7 g/dL (ref 32.0–36.0)
MCV: 87.7 fL (ref 80.0–100.0)
MCV: 88.4 fL (ref 80.0–100.0)
MCV: 88.8 fL (ref 80.0–100.0)
PLATELETS: 187 10*3/uL (ref 150–440)
PLATELETS: 192 10*3/uL (ref 150–440)
Platelets: 200 10*3/uL (ref 150–440)
RBC: 2.52 MIL/uL — ABNORMAL LOW (ref 3.80–5.20)
RBC: 2.82 MIL/uL — AB (ref 3.80–5.20)
RBC: 2.85 MIL/uL — AB (ref 3.80–5.20)
RDW: 12.8 % (ref 11.5–14.5)
RDW: 12.9 % (ref 11.5–14.5)
RDW: 13.1 % (ref 11.5–14.5)
WBC: 11 10*3/uL (ref 3.6–11.0)
WBC: 11.7 10*3/uL — AB (ref 3.6–11.0)
WBC: 12.9 10*3/uL — AB (ref 3.6–11.0)

## 2016-01-21 LAB — PROTIME-INR
INR: 1.06
Prothrombin Time: 14 seconds (ref 11.4–15.0)

## 2016-01-21 LAB — FIBRINOGEN: Fibrinogen: 307 mg/dL (ref 210–470)

## 2016-01-21 LAB — APTT

## 2016-01-21 LAB — ABO/RH: ABO/RH(D): B POS

## 2016-01-21 LAB — PREPARE RBC (CROSSMATCH)

## 2016-01-21 SURGERY — DILATION AND EVACUATION, UTERUS
Anesthesia: General | Wound class: Clean Contaminated

## 2016-01-21 MED ORDER — HYDROMORPHONE HCL 1 MG/ML IJ SOLN
INTRAMUSCULAR | Status: DC | PRN
  Administered 2016-01-21: 1 mg via INTRAVENOUS

## 2016-01-21 MED ORDER — PHENYLEPHRINE HCL 10 MG/ML IJ SOLN
INTRAMUSCULAR | Status: DC | PRN
  Administered 2016-01-21: 300 ug via INTRAVENOUS

## 2016-01-21 MED ORDER — FERROUS SULFATE 325 (65 FE) MG PO TABS: 325.0000 mg | ORAL_TABLET | Freq: Two times a day (BID) | ORAL | Status: DC

## 2016-01-21 MED ORDER — SODIUM CHLORIDE 0.9 % IV SOLN: Freq: Once | INTRAVENOUS | Status: DC

## 2016-01-21 MED ORDER — MENTHOL 3 MG MT LOZG: 1.0000 | LOZENGE | OROMUCOSAL | Status: DC | PRN

## 2016-01-21 MED ORDER — DOXYCYCLINE HYCLATE 100 MG IV SOLR
100.0000 mg | INTRAVENOUS | Status: AC
  Administered 2016-01-21: 100 mg via INTRAVENOUS
  Filled 2016-01-21: qty 100

## 2016-01-21 MED ORDER — HYDROMORPHONE HCL 2 MG PO TABS: 2.0000 mg | ORAL_TABLET | ORAL | Status: DC | PRN

## 2016-01-21 MED ORDER — METHYLERGONOVINE MALEATE 0.2 MG/ML IJ SOLN
INTRAMUSCULAR | Status: AC
  Administered 2016-01-21: 0.2 mg via INTRAMUSCULAR
  Filled 2016-01-21: qty 1

## 2016-01-21 MED ORDER — IBUPROFEN 600 MG PO TABS: 600.0000 mg | ORAL_TABLET | Freq: Four times a day (QID) | ORAL | Status: DC | PRN

## 2016-01-21 MED ORDER — DEXAMETHASONE SODIUM PHOSPHATE 4 MG/ML IJ SOLN
INTRAMUSCULAR | Status: DC | PRN
  Administered 2016-01-21: 5 mg via INTRAVENOUS

## 2016-01-21 MED ORDER — LIDOCAINE HCL (CARDIAC) 20 MG/ML IV SOLN
INTRAVENOUS | Status: DC | PRN
  Administered 2016-01-21: 100 mg via INTRAVENOUS

## 2016-01-21 MED ORDER — ONDANSETRON HCL 4 MG/2ML IJ SOLN
INTRAMUSCULAR | Status: DC | PRN
  Administered 2016-01-21: 4 mg via INTRAVENOUS

## 2016-01-21 MED ORDER — SODIUM CHLORIDE 0.9 % IV SOLN
Freq: Once | INTRAVENOUS | Status: AC
  Administered 2016-01-21: 11:00:00 via INTRAVENOUS

## 2016-01-21 MED ORDER — SIMETHICONE 80 MG PO CHEW
80.0000 mg | CHEWABLE_TABLET | Freq: Four times a day (QID) | ORAL | Status: DC | PRN
  Filled 2016-01-21: qty 1

## 2016-01-21 MED ORDER — ONDANSETRON HCL 4 MG/2ML IJ SOLN: 4.0000 mg | Freq: Once | INTRAMUSCULAR | Status: DC | PRN

## 2016-01-21 MED ORDER — DOXYCYCLINE HYCLATE 100 MG PO TABS: 200.0000 mg | ORAL_TABLET | Freq: Once | ORAL | Status: DC

## 2016-01-21 MED ORDER — ALUM & MAG HYDROXIDE-SIMETH 200-200-20 MG/5ML PO SUSP: 30.0000 mL | ORAL | Status: DC | PRN

## 2016-01-21 MED ORDER — FENTANYL CITRATE (PF) 100 MCG/2ML IJ SOLN: 25.0000 ug | INTRAMUSCULAR | Status: DC | PRN

## 2016-01-21 MED ORDER — DIPHENHYDRAMINE HCL 25 MG PO CAPS: 50.0000 mg | ORAL_CAPSULE | Freq: Four times a day (QID) | ORAL | Status: DC | PRN

## 2016-01-21 MED ORDER — FENTANYL CITRATE (PF) 100 MCG/2ML IJ SOLN
INTRAMUSCULAR | Status: DC | PRN
Start: 2016-01-21 — End: 2016-01-21
  Administered 2016-01-21: 100 ug via INTRAVENOUS

## 2016-01-21 MED ORDER — METHYLERGONOVINE MALEATE 0.2 MG PO TABS: 0.2000 mg | ORAL_TABLET | Freq: Four times a day (QID) | ORAL | Status: DC

## 2016-01-21 MED ORDER — ONDANSETRON HCL 4 MG PO TABS
4.0000 mg | ORAL_TABLET | Freq: Four times a day (QID) | ORAL | Status: DC | PRN
  Administered 2016-01-21: 4 mg via ORAL
  Filled 2016-01-21: qty 1

## 2016-01-21 MED ORDER — ONDANSETRON HCL 4 MG/2ML IJ SOLN: 4.0000 mg | Freq: Four times a day (QID) | INTRAMUSCULAR | Status: DC | PRN

## 2016-01-21 MED ORDER — METHYLERGONOVINE MALEATE 0.2 MG/ML IJ SOLN
0.2000 mg | Freq: Three times a day (TID) | INTRAMUSCULAR | Status: DC
  Administered 2016-01-21: 0.2 mg via INTRAMUSCULAR
  Filled 2016-01-21 (×3): qty 1

## 2016-01-21 MED ORDER — PROPOFOL 10 MG/ML IV BOLUS
INTRAVENOUS | Status: DC | PRN
  Administered 2016-01-21: 140 mg via INTRAVENOUS

## 2016-01-21 MED ORDER — METHYLERGONOVINE MALEATE 0.2 MG PO TABS
0.2000 mg | ORAL_TABLET | Freq: Three times a day (TID) | ORAL | Status: DC
  Administered 2016-01-21: 0.2 mg via ORAL
  Filled 2016-01-21: qty 1

## 2016-01-21 MED ORDER — DEXTROSE-NACL 5-0.45 % IV SOLN
INTRAVENOUS | Status: DC
  Administered 2016-01-21 (×2): via INTRAVENOUS

## 2016-01-21 MED ORDER — MIDAZOLAM HCL 2 MG/2ML IJ SOLN
INTRAMUSCULAR | Status: DC | PRN
  Administered 2016-01-21: 2 mg via INTRAVENOUS

## 2016-01-21 SURGICAL SUPPLY — 18 items
CATH ROBINSON RED A/P 16FR (CATHETERS) IMPLANT
FILTER UTR ASPR SPEC (MISCELLANEOUS) IMPLANT
FLTR UTR ASPR SPEC (MISCELLANEOUS)
GLOVE BIO SURGEON STRL SZ7 (GLOVE) ×9 IMPLANT
GOWN STRL REUS W/ TWL LRG LVL3 (GOWN DISPOSABLE) ×2 IMPLANT
GOWN STRL REUS W/TWL LRG LVL3 (GOWN DISPOSABLE) ×6
KIT BERKELEY 1ST TRIMESTER 3/8 (MISCELLANEOUS) ×3 IMPLANT
KIT RM TURNOVER CYSTO AR (KITS) ×3 IMPLANT
NS IRRIG 500ML POUR BTL (IV SOLUTION) ×3 IMPLANT
PACK DNC HYST (MISCELLANEOUS) ×3 IMPLANT
PAD OB MATERNITY 4.3X12.25 (PERSONAL CARE ITEMS) ×3 IMPLANT
PAD PREP 24X41 OB/GYN DISP (PERSONAL CARE ITEMS) ×3 IMPLANT
SET BERKELEY SUCTION TUBING (SUCTIONS) ×3 IMPLANT
TOWEL OR 17X26 4PK STRL BLUE (TOWEL DISPOSABLE) ×3 IMPLANT
VACURETTE 10 RIGID CVD (CANNULA) ×3 IMPLANT
VACURETTE 12 RIGID CVD (CANNULA) IMPLANT
VACURETTE 8 RIGID CVD (CANNULA) IMPLANT
VACURETTE 8MM F TIP (MISCELLANEOUS) ×3 IMPLANT

## 2016-01-21 NOTE — Anesthesia Preprocedure Evaluation (Signed)
Anesthesia Evaluation  Patient identified by MRN, date of birth, ID band Patient awake    Reviewed: Allergy & Precautions, NPO status , Patient's Chart, lab work & pertinent test results  Airway Mallampati: II  TM Distance: >3 FB Neck ROM: Full    Dental no notable dental hx.    Pulmonary neg pulmonary ROS,    Pulmonary exam normal        Cardiovascular negative cardio ROS Normal cardiovascular exam     Neuro/Psych negative neurological ROS  negative psych ROS   GI/Hepatic negative GI ROS, Neg liver ROS,   Endo/Other  negative endocrine ROS  Renal/GU negative Renal ROS  Female GU complaint Increased vaginal bleeding    Musculoskeletal negative musculoskeletal ROS (+)   Abdominal Normal abdominal exam  (+)   Peds negative pediatric ROS (+)  Hematology  (+) anemia ,   Anesthesia Other Findings   Reproductive/Obstetrics                             Anesthesia Physical Anesthesia Plan  ASA: II and emergent  Anesthesia Plan: General   Post-op Pain Management:    Induction: Intravenous, Cricoid pressure planned and Rapid sequence  Airway Management Planned: Oral ETT  Additional Equipment:   Intra-op Plan:   Post-operative Plan: Extubation in OR  Informed Consent: I have reviewed the patients History and Physical, chart, labs and discussed the procedure including the risks, benefits and alternatives for the proposed anesthesia with the patient or authorized representative who has indicated his/her understanding and acceptance.   Dental advisory given  Plan Discussed with: CRNA and Surgeon  Anesthesia Plan Comments:         Anesthesia Quick Evaluation

## 2016-01-21 NOTE — Progress Notes (Addendum)
Patient with another episode of hypotension and tachcardia 65/40 pulse 125.  Bleeding has failed to slow down cervix remains closed at the internal cervical os about 1.5cm at the external os.  FHT still visualized on bedside ultrasound.  Given continued active bleeding and apparent hemodynamic instability I would classify this as an inevitable abortion.  I discussed continued monitoring vs proceeding with D&C, with my recommendation being proceeding with D&C for the previously mention changes in vitals.  The patient and her husband are in agreement to proceed with D&C.  Blood bank called and 4 units of pRBC set up for transfusion, repeat CBC, PT, PTT ordered.  I did also offer proceeding with blood administration and continued observation.

## 2016-01-21 NOTE — Transfer of Care (Signed)
Immediate Anesthesia Transfer of Care Note  Patient: Roberta Coleman  Procedure(s) Performed: Procedure(s): Suction D & C (N/A)  Patient Location: PACU  Anesthesia Type:General  Level of Consciousness: awake, oriented and patient cooperative  Airway & Oxygen Therapy: Patient Spontanous Breathing and Patient connected to nasal cannula oxygen  Post-op Assessment: Report given to RN and Post -op Vital signs reviewed and stable  Post vital signs: Reviewed and stable  Last Vitals:  Filed Vitals:   01/21/16 0121 01/21/16 0125  BP: 109/59 125/64  Pulse: 123 109  Temp:    Resp:      Last Pain:  Filed Vitals:   01/21/16 0127  PainSc: 4          Complications: No apparent anesthesia complications

## 2016-01-21 NOTE — Anesthesia Postprocedure Evaluation (Signed)
Anesthesia Post Note  Patient: Roberta Coleman  Procedure(s) Performed: Procedure(s) (LRB): Suction D & C (N/A)  Patient location during evaluation: PACU Anesthesia Type: General Level of consciousness: awake and alert and oriented Pain management: pain level controlled Vital Signs Assessment: post-procedure vital signs reviewed and stable Respiratory status: spontaneous breathing Cardiovascular status: blood pressure returned to baseline Anesthetic complications: no    Last Vitals:  Filed Vitals:   01/21/16 0240 01/21/16 0245  BP: 71/35 87/34  Pulse: 115 109  Temp:    Resp: 17 20    Last Pain:  Filed Vitals:   01/21/16 0248  PainSc: 4                  Nabiha Planck

## 2016-01-21 NOTE — Progress Notes (Signed)
Preoperative CBC and DIC labs reviewed.  Further drop in H&H as expected with normal coags and fibrinogen.  Results for orders placed or performed during the hospital encounter of 01/20/16 (from the past 24 hour(s))  hCG, quantitative, pregnancy     Status: Abnormal   Collection Time: 01/20/16  6:55 PM  Result Value Ref Range   hCG, Beta Chain, Roberta Coleman, S [id number] (H) <5 mIU/mL  APTT     Status: None   Collection Time: 01/20/16  6:55 PM  Result Value Ref Range   aPTT 26 24 - 36 seconds  Protime-INR     Status: None   Collection Time: 01/20/16  6:55 PM  Result Value Ref Range   Prothrombin Time 12.7 11.4 - 15.0 seconds   INR 0.93   CBC with Differential/Platelet     Status: Abnormal   Collection Time: 01/20/16  6:55 PM  Result Value Ref Range   WBC 11.2 (H) 3.6 - 11.0 K/uL   RBC 4.57 3.80 - 5.20 MIL/uL   Hemoglobin 13.8 12.0 - 16.0 g/dL   HCT 04.5 40.9 - 81.1 %   MCV 88.6 80.0 - 100.0 fL   MCH 30.1 26.0 - 34.0 pg   MCHC 34.0 32.0 - 36.0 g/dL   RDW 91.4 78.2 - 95.6 %   Platelets 261 150 - 440 K/uL   Neutrophils Relative % 66 %   Neutro Abs 7.5 (H) 1.4 - 6.5 K/uL   Lymphocytes Relative 22 %   Lymphs Abs 2.4 1.0 - 3.6 K/uL   Monocytes Relative 9 %   Monocytes Absolute 1.0 (H) 0.2 - 0.9 K/uL   Eosinophils Relative 2 %   Eosinophils Absolute 0.2 0 - 0.7 K/uL   Basophils Relative 1 %   Basophils Absolute 0.1 0 - 0.1 K/uL  Type and screen [facility]     Status: None (Preliminary result)   Collection Time: 01/20/16  6:56 PM  Result Value Ref Range   ABO/RH(D) B POS    Antibody Screen NEG    Sample Expiration 01/23/2016    Unit Number [id number]    Blood Component Type RED CELLS,LR    Unit division 00    Status of Unit ALLOCATED    Transfusion Status OK TO TRANSFUSE    Crossmatch Result Compatible    Unit Number [id number]    Blood Component Type RBC, LR IRR    Unit division 00    Status of Unit ALLOCATED    Transfusion Status OK TO TRANSFUSE    Crossmatch Result Compatible   CBC     Status: Abnormal   Collection Time: 01/20/16  9:10 PM  Result Value Ref Range   WBC 9.4 3.6 - 11.0 K/uL   RBC 3.43 (L) 3.80 - 5.20 MIL/uL   Hemoglobin 10.6 (L) 12.0 - 16.0 g/dL   HCT 02.7 (L) 25.3 - 66.4 %   MCV 88.4 80.0 - 100.0 fL   MCH 30.9 26.0 - 34.0 pg   MCHC 35.0 32.0 - 36.0 g/dL   RDW 40.3 47.4 - 25.9 %   Platelets 217 150 - 440 K/uL  CBC     Status: Abnormal   Collection Time: 01/21/16 12:59 AM  Result Value Ref Range   WBC 11.0 3.6 - 11.0 K/uL   RBC 2.82 (L) 3.80 - 5.20 MIL/uL   Hemoglobin 8.7 (L) 12.0 - 16.0 g/dL   HCT 56.3 (L) 87.5 - 64.3 %   MCV 87.7 80.0 - 100.0 fL   MCH  30.9 26.0 - 34.0 pg   MCHC 35.2 32.0 - 36.0 g/dL   RDW 16.112.8 09.611.5 - 04.514.5 %   Platelets 200 150 - 440 K/uL  Protime-INR     Status: None   Collection Time: 01/21/16 12:59 AM  Result Value Ref Range   Prothrombin Time 14.0 11.4 - 15.0 seconds   INR 1.06   Fibrinogen     Status: None   Collection Time: 01/21/16 12:59 AM  Result Value Ref Range   Fibrinogen 307 210 - 470 mg/dL  Prepare RBC     Status: None   Collection Time: 01/21/16 12:59 AM  Result Value Ref Range   Order Confirmation ORDER PROCESSED BY BLOOD BANK   ABO/Rh     Status: None   Collection Time: 01/21/16 12:59 AM  Result Value Ref Range   ABO/RH(D) B POS

## 2016-01-21 NOTE — Op Note (Signed)
Patient Name: Royal Charlott RakesL Banik Date of Procedure: 01/21/2016   Preoperative Diagnosis: 1) 30 y.o. G2P1001 at 9 weeks 1 day 2) Inevitable abortion 3) Acute blood loss anemia  Postoperative Diagnosis: 1) 30 y.o. G2P1001 at 9 weeks 1 day 2) Inevitable abortion 3) Acute blood loss anemia  Operation Performed: Suction dilation and curettage  Indication: Patient presented to ER with heavy bleeding, viable IUP on presentation.   External os dilated but internal os still closed.  The patient opted for expectant management given the positive fetal heart tones.  Patient continued to have bleeding with drop in Hgb from 13.8 to 10.6 to 8.7.  Anesthesia: General  Primary Surgeon: Vena AustriaAndreas Earlene Bjelland, MD  Assistant: none  Preoperative Antibiotics: 100mg  of doxycyline  Estimated Blood Loss: 400mL  IV Fluids: 1300mL  Drains or Tubes: none  Implants: none  Specimens Removed: Products of conception  Complications: none  Intraoperative Findings:  Cervix dilated approximately 3cm at external cervical os, 10 week size extremely anteverted uterus  Patient Condition: stable  Procedure in Detail:  Patient was taken to the operating room were she was administered general endotracheal anesthesia.  She was positioned in the dorsal lithotomy position utilizing Allen stirups, prepped and draped in the usual sterile fashion.  Uterus was noted to be 10 weeks in size, anterior.   Prior to proceeding with the case a time out was performed.  Attention was turned to the patient's pelvis.  The patient had an indwelling foley catheter in place already to decompress the bladder.  An operative speculum was placed to allow visualization of the cervix.  The anterior lip of the cervix was grasped with a single tooth tenaculum, given degree of cervical dilation no further dilation was necessary.  A size 8 flexible suction curette was advanced to the uterine fundus, several passes were taken to evacuate uterus.  Sharp  curettage was performed noting good uterine cry, with a final pass of the size 8 suction curette.  The resulting specimen collected and sent to pathology.  The cervix was inspected with some continued bleeding noted that was brisker than acceptable.  One pass was undertaken using a size 10 curved suction curette with no additional tissue noted.  Bimanual uterine massage was performed which resulted in cessation of bleeding.  The patient was administered 0.2mg  of IM methergine as well.  Sponge needle and instrument counts were corrects times two.  The patient tolerated the procedure well and was taken to the recovery room in stable condition.

## 2016-01-21 NOTE — Anesthesia Procedure Notes (Signed)
Procedure Name: Intubation Date/Time: 01/21/2016 1:52 AM Performed by: Shirlee LimerickMARION, Jan Olano Pre-anesthesia Checklist: Patient identified, Emergency Drugs available, Suction available and Patient being monitored Patient Re-evaluated:Patient Re-evaluated prior to inductionOxygen Delivery Method: Circle system utilized Preoxygenation: Pre-oxygenation with 100% oxygen Intubation Type: IV induction Laryngoscope Size: Mac and 3 Grade View: Grade I Tube type: Oral Tube size: 7.0 mm Number of attempts: 1 Placement Confirmation: ETT inserted through vocal cords under direct vision,  positive ETCO2 and breath sounds checked- equal and bilateral Secured at: 21 cm Tube secured with: Tape Dental Injury: Teeth and Oropharynx as per pre-operative assessment

## 2016-01-21 NOTE — Progress Notes (Signed)
Notify PotreroElizabeth, Rn of bp

## 2016-01-21 NOTE — Progress Notes (Signed)
Patient with orthostatic vitals and is symptomatic.  Given severe anemia with symptoms and orthostatic vitals, mutual decision made for transfusion of 1 unit pRBCs and reevaluate. I personally reviewed the risks/benefits of a blood transfusion.   She agrees to the transfusion.   Thomasene MohairStephen Yehonatan Grandison, MD 01/21/2016 10:36 AM

## 2016-01-21 NOTE — Discharge Summary (Addendum)
Physician Discharge Summary  Patient ID: Roberta Coleman MRN: [id number] DOB/AGE: 1986-03-06 30 y.o.  Admit date: 01/20/2016 Discharge date: 01/21/2016  Admission Diagnoses: Threatened abortion  Discharge Diagnoses:  Inevitable abortion  Discharged Condition: good  Hospital Course: Patient presented to ER with heavy bleeding at [redacted] weeks gestation, viable IUP at presentation.  She continued to have heavy bleeding with Hgb on presentation 13.2 with drop to 8.7.  On presentation external cervical os open but unable to get past internal cervical os.  The patient became hemodynamically unstable with drop in BP to 60's/40's and and tachycardia.  She improved hemodynamically with fluid bolus but given downward trend in hemoglobin decision made to proceed with D&C.  The patient underwent uncomplicated D&C on 01/21/16.  She had an additional EBL intraoperatively and her postoperative H&H was noted to be 7.7.  Right before initial discharge order she was orthostatic and symptomatic.  She received 1 unit pRBCs.  Her post-transfusion blood counts bumped appropriately. She was hemodynamically stable and cleared for discharge.   Consults: None  Significant Diagnostic Studies:  Results for orders placed or performed during the hospital encounter of 01/20/16 (from the past 72 hour(s))  hCG, quantitative, pregnancy     Status: Abnormal   Collection Time: 01/20/16  6:55 PM  Result Value Ref Range   hCG, Beta Chain, Quant, S 409811 (H) <5 mIU/mL    Comment:          GEST. AGE      CONC.  (mIU/mL)   <=1 WEEK        5 - 50     2 WEEKS       50 - 500     3 WEEKS       100 - 10,000     4 WEEKS     1,000 - 30,000     5 WEEKS     3,500 - 115,000   6-8 WEEKS     12,000 - 270,000    12 WEEKS     15,000 - 220,000        FEMALE AND NON-PREGNANT FEMALE:     LESS THAN 5 mIU/mL   APTT     Status: None   Collection Time: 01/20/16  6:55 PM  Result Value Ref Range   aPTT 26 24 - 36 seconds  Protime-INR      Status: None   Collection Time: 01/20/16  6:55 PM  Result Value Ref Range   Prothrombin Time 12.7 11.4 - 15.0 seconds   INR 0.93   CBC with Differential/Platelet     Status: Abnormal   Collection Time: 01/20/16  6:55 PM  Result Value Ref Range   WBC 11.2 (H) 3.6 - 11.0 K/uL   RBC 4.57 3.80 - 5.20 MIL/uL   Hemoglobin 13.8 12.0 - 16.0 g/dL   HCT 91.4 78.2 - 95.6 %   MCV 88.6 80.0 - 100.0 fL   MCH 30.1 26.0 - 34.0 pg   MCHC 34.0 32.0 - 36.0 g/dL   RDW 21.3 08.6 - 57.8 %   Platelets 261 150 - 440 K/uL   Neutrophils Relative % 66 %   Neutro Abs 7.5 (H) 1.4 - 6.5 K/uL   Lymphocytes Relative 22 %   Lymphs Abs 2.4 1.0 - 3.6 K/uL   Monocytes Relative 9 %   Monocytes Absolute 1.0 (H) 0.2 - 0.9 K/uL   Eosinophils Relative 2 %   Eosinophils Absolute 0.2 0 - 0.7 K/uL   Basophils  Relative 1 %   Basophils Absolute 0.1 0 - 0.1 K/uL  Type and screen [facility]     Status: None (Preliminary result)   Collection Time: 01/20/16  6:56 PM  Result Value Ref Range   ABO/RH(D) B POS    Antibody Screen NEG    Sample Expiration 01/23/2016    Unit Number [id number]    Blood Component Type RED CELLS,LR    Unit division 00    Status of Unit ALLOCATED    Transfusion Status OK TO TRANSFUSE    Crossmatch Result Compatible    Unit Number [id number]    Blood Component Type RBC, LR IRR    Unit division 00    Status of Unit ALLOCATED    Transfusion Status OK TO TRANSFUSE    Crossmatch Result Compatible    Unit Number [id number]    Blood Component Type RED CELLS,LR    Unit division 00    Status of Unit ALLOCATED    Transfusion Status OK TO TRANSFUSE    Crossmatch Result Compatible    Unit Number [id number]    Blood Component Type RBC, LR IRR    Unit division 00    Status of Unit ALLOCATED    Transfusion Status OK TO TRANSFUSE    Crossmatch Result Compatible   CBC     Status: Abnormal   Collection Time: 01/20/16  9:10 PM  Result Value Ref Range   WBC 9.4 3.6 -  11.0 K/uL   RBC 3.43 (L) 3.80 - 5.20 MIL/uL   Hemoglobin 10.6 (L) 12.0 - 16.0 g/dL   HCT 64.3 (L) 32.9 - 51.8 %   MCV 88.4 80.0 - 100.0 fL   MCH 30.9 26.0 - 34.0 pg   MCHC 35.0 32.0 - 36.0 g/dL   RDW 84.1 66.0 - 63.0 %   Platelets 217 150 - 440 K/uL  CBC     Status: Abnormal   Collection Time: 01/21/16 12:59 AM  Result Value Ref Range   WBC 11.0 3.6 - 11.0 K/uL   RBC 2.82 (L) 3.80 - 5.20 MIL/uL   Hemoglobin 8.7 (L) 12.0 - 16.0 g/dL   HCT 16.0 (L) 10.9 - 32.3 %   MCV 87.7 80.0 - 100.0 fL   MCH 30.9 26.0 - 34.0 pg   MCHC 35.2 32.0 - 36.0 g/dL   RDW 55.7 32.2 - 02.5 %   Platelets 200 150 - 440 K/uL  Protime-INR     Status: None   Collection Time: 01/21/16 12:59 AM  Result Value Ref Range   Prothrombin Time 14.0 11.4 - 15.0 seconds   INR 1.06   APTT     Status: Abnormal   Collection Time: 01/21/16 12:59 AM  Result Value Ref Range   aPTT <24 (L) 24 - 36 seconds  Fibrinogen     Status: None   Collection Time: 01/21/16 12:59 AM  Result Value Ref Range   Fibrinogen 307 210 - 470 mg/dL  Prepare RBC     Status: None   Collection Time: 01/21/16 12:59 AM  Result Value Ref Range   Order Confirmation ORDER PROCESSED BY BLOOD BANK   ABO/Rh     Status: None   Collection Time: 01/21/16 12:59 AM  Result Value Ref Range   ABO/RH(D) B POS   CBC     Status: Abnormal   Collection Time: 01/21/16  5:58 AM  Result Value Ref Range   WBC 11.7 (H) 3.6 - 11.0 K/uL   RBC 2.52 (L)  3.80 - 5.20 MIL/uL   Hemoglobin 7.7 (L) 12.0 - 16.0 g/dL   HCT 16.122.4 (L) 09.635.0 - 04.547.0 %   MCV 88.8 80.0 - 100.0 fL   MCH 30.4 26.0 - 34.0 pg   MCHC 34.2 32.0 - 36.0 g/dL   RDW 40.912.9 81.111.5 - 91.414.5 %   Platelets 187 150 - 440 K/uL     Treatments: IV hydration and surgery: Suction D&C  Discharge Exam: Blood pressure 99/46, pulse 78, temperature 98.3 F (36.8 C), temperature source Oral, resp. rate 16, height 5\' 3"  (1.6 m), weight 81.647 kg (180 lb), last menstrual period 11/09/2015, SpO2 100 %. General appearance:  alert, appears stated age and no distress Resp: clear to auscultation bilaterally Cardio: regular rate and rhythm, S1, S2 normal, no murmur, click, rub or gallop GI: soft, non-tender; bowel sounds normal; no masses,  no organomegaly Extremities: extremities normal, atraumatic, no cyanosis or edema  Disposition:       Discharge Instructions    Activity as tolerated    Complete by:  As directed      Call MD for:  difficulty breathing, headache or visual disturbances    Complete by:  As directed      Call MD for:  extreme fatigue    Complete by:  As directed      Call MD for:  hives    Complete by:  As directed      Call MD for:  persistant dizziness or light-headedness    Complete by:  As directed      Call MD for:  persistant nausea and vomiting    Complete by:  As directed      Call MD for:  severe uncontrolled pain    Complete by:  As directed      Call MD for:  temperature >100.4    Complete by:  As directed      Sexual acrtivity    Complete by:  As directed   No intercourse for 6 week, no tampons for 6 weeks            Medication List    STOP taking these medications        misoprostol 200 MCG tablet  Commonly known as:  CYTOTEC     norethindrone-ethinyl estradiol-iron 1.5-30 MG-MCG tablet  Commonly known as:  MICROGESTIN FE,GILDESS FE,LOESTRIN FE      TAKE these medications        ferrous sulfate 325 (65 FE) MG tablet  Commonly known as:  FERROUSUL  Take 1 tablet (325 mg total) by mouth 2 (two) times daily.     HYDROmorphone 2 MG tablet  Commonly known as:  DILAUDID  Take 1 tablet (2 mg total) by mouth every 3 (three) hours as needed for moderate pain or severe pain.     ibuprofen 600 MG tablet  Commonly known as:  ADVIL,MOTRIN  Take 1 tablet (600 mg total) by mouth every 6 (six) hours as needed (mild pain).     methylergonovine 0.2 MG tablet  Commonly known as:  METHERGINE  Take 1 tablet (0.2 mg total) by mouth every 6 (six) hours.       Follow-up  Information    Follow up with Lorrene ReidSTAEBLER, ANDREAS M, MD In 1 week.   Specialty:  Obstetrics and Gynecology   Why:  postop   Contact information:   [address] [place] [id number]       Signed: Lorrene ReidSTAEBLER, ANDREAS M 01/21/2016, 8:14 AM  ADDENDUM  signature: Thomasene Mohair, MD 01/21/2016 10:09 PM

## 2016-01-22 NOTE — Discharge Summary (Signed)
Reviewed D/C instructions with patient and her significant other including when to call the MD, activity restrictions, f/u appointments, and prescriptions.  Retained a signed copy of the instructions for the chart and provided the other to the patient along with prescriptions to be filled. D/C'd patient home via wheelchair, escorted by nursing staff.

## 2016-01-24 LAB — TYPE AND SCREEN
ABO/RH(D): B POS
Antibody Screen: NEGATIVE
UNIT DIVISION: 0
UNIT DIVISION: 0
Unit division: 0
Unit division: 0

## 2016-01-24 LAB — SURGICAL PATHOLOGY

## 2016-11-25 ENCOUNTER — Encounter (HOSPITAL_COMMUNITY): Payer: Self-pay

## 2017-01-01 ENCOUNTER — Telehealth: Payer: Self-pay

## 2017-01-01 ENCOUNTER — Other Ambulatory Visit: Payer: Self-pay | Admitting: Obstetrics and Gynecology

## 2017-01-01 MED ORDER — NORETHIN-ETH ESTRAD-FE BIPHAS 1 MG-10 MCG / 10 MCG PO TABS: 1.0000 | ORAL_TABLET | Freq: Every day | ORAL | 3 refills | Status: DC

## 2017-01-01 NOTE — Telephone Encounter (Signed)
Pt aware, via voicemail, Rx has been sent to pharmacy 

## 2017-01-01 NOTE — Telephone Encounter (Signed)
Please advise 

## 2017-01-01 NOTE — Telephone Encounter (Signed)
Pt was given samples of bcp and would like rx called in.  [phone]

## 2017-01-01 NOTE — Telephone Encounter (Signed)
[facility] sent to [facility] on file

## 2017-12-06 ENCOUNTER — Other Ambulatory Visit: Payer: Self-pay | Admitting: Obstetrics and Gynecology

## 2017-12-20 ENCOUNTER — Other Ambulatory Visit: Payer: Self-pay | Admitting: Obstetrics and Gynecology

## 2018-01-30 ENCOUNTER — Emergency Department
Admission: EM | Admit: 2018-01-30 | Discharge: 2018-01-30 | Disposition: A | Discharge status: Home / Self Care | Payer: BLUE CROSS/BLUE SHIELD | Attending: Emergency Medicine | Admitting: Emergency Medicine

## 2018-01-30 ENCOUNTER — Encounter: Payer: Self-pay | Admitting: Emergency Medicine

## 2018-01-30 ENCOUNTER — Other Ambulatory Visit: Payer: Self-pay

## 2018-01-30 ENCOUNTER — Other Ambulatory Visit
Admission: RE | Admit: 2018-01-30 | Discharge: 2018-01-30 | Disposition: A | Discharge status: Home / Self Care | Payer: BLUE CROSS/BLUE SHIELD | Source: Ambulatory Visit | Attending: Family Medicine | Admitting: Family Medicine

## 2018-01-30 ENCOUNTER — Emergency Department: Payer: BLUE CROSS/BLUE SHIELD

## 2018-01-30 DIAGNOSIS — K529 Noninfective gastroenteritis and colitis, unspecified: Secondary | ICD-10-CM | POA: Insufficient documentation

## 2018-01-30 DIAGNOSIS — Z79899 Other long term (current) drug therapy: Secondary | ICD-10-CM | POA: Diagnosis not present

## 2018-01-30 DIAGNOSIS — R079 Chest pain, unspecified: Secondary | ICD-10-CM | POA: Diagnosis present

## 2018-01-30 DIAGNOSIS — R0781 Pleurodynia: Secondary | ICD-10-CM

## 2018-01-30 LAB — FIBRIN DERIVATIVES D-DIMER (ARMC ONLY): Fibrin derivatives D-dimer (ARMC): 1405.39 ng/mL (FEU) — ABNORMAL HIGH (ref 0.00–499.00)

## 2018-01-30 LAB — COMPREHENSIVE METABOLIC PANEL
ALK PHOS: 47 U/L (ref 38–126)
ALT: 27 U/L (ref 0–44)
ANION GAP: 12 (ref 5–15)
AST: 20 U/L (ref 15–41)
Albumin: 4.3 g/dL (ref 3.5–5.0)
BILIRUBIN TOTAL: 0.9 mg/dL (ref 0.3–1.2)
BUN: 13 mg/dL (ref 6–20)
CALCIUM: 9.4 mg/dL (ref 8.9–10.3)
CO2: 23 mmol/L (ref 22–32)
Chloride: 102 mmol/L (ref 98–111)
Creatinine, Ser: 0.74 mg/dL (ref 0.44–1.00)
GFR calc non Af Amer: >60 mL/min (ref >60)
GLUCOSE: 86 mg/dL (ref 70–99)
Potassium: 4 mmol/L (ref 3.5–5.1)
Sodium: 137 mmol/L (ref 135–145)
TOTAL PROTEIN: 8.3 g/dL — AB (ref 6.5–8.1)

## 2018-01-30 LAB — LIPASE, BLOOD: LIPASE: 29 U/L (ref 11–51)

## 2018-01-30 LAB — POCT PREGNANCY, URINE: Preg Test, Ur: NEGATIVE

## 2018-01-30 MED ORDER — AMOXICILLIN-POT CLAVULANATE 875-125 MG PO TABS: 1.0000 | ORAL_TABLET | Freq: Two times a day (BID) | ORAL | 0 refills | Status: DC

## 2018-01-30 MED ORDER — IOPAMIDOL (ISOVUE-370) INJECTION 76%
100.0000 mL | Freq: Once | INTRAVENOUS | Status: AC | PRN
  Administered 2018-01-30: 100 mL via INTRAVENOUS
  Filled 2018-01-30: qty 100

## 2018-01-30 MED ORDER — AMOXICILLIN-POT CLAVULANATE 875-125 MG PO TABS: 1.0000 | ORAL_TABLET | Freq: Two times a day (BID) | ORAL | 0 refills | Status: AC

## 2018-01-30 MED ORDER — DICYCLOMINE HCL 20 MG PO TABS: 20.0000 mg | ORAL_TABLET | Freq: Three times a day (TID) | ORAL | 0 refills | Status: DC | PRN

## 2018-01-30 NOTE — ED Triage Notes (Signed)
[facility] radiating to left shoulder, SOB with inspiration.  Denies n/v/d, fevers, denies chest pain.  [facility] had positive d-dimer and concerned for PE.  [facility]

## 2018-01-30 NOTE — ED Notes (Signed)
AAOx3.  Skin warm and dry.  NAD 

## 2018-01-30 NOTE — Discharge Instructions (Addendum)
Please seek medical attention for any high fevers, chest pain, shortness of breath, change in behavior, persistent vomiting, bloody stool or any other new or concerning symptoms.  

## 2018-01-30 NOTE — ED Provider Notes (Signed)
[facility] Emergency Department Provider Note   ____________________________________________   I have reviewed the triage vital signs and the nursing notes.   HISTORY  Chief Complaint Shortness of Breath   History limited by: Not Limited   HPI Roberta Coleman is a 32 y.o. female who presents to the emergency department today from walk-in clinic because of concerns for possible PE given elevated d-dimer.  Patient went to walk-in clinic because of concerns with left lower chest and left shoulder pain.  The left lower chest pain started roughly 2 days ago.  It is worse with deep breaths.  She also is complaining of some left shoulder pain is also begun.  This will also be triggered by deep breaths and walls to be triggered by lying flat.  Patient denies any associated nausea or vomiting.  No coughing.  She is on birth control.    Per medical record review patient has a history of HLD  Past Medical History:  Diagnosis Date  . Allergy   . High cholesterol   . Hyperlipidemia   . Obesity     Patient Active Problem List   Diagnosis Date Noted  . S/P D&C (status post dilation and curettage) 01/21/2016  . Threatened abortion 01/20/2016  . URI (upper respiratory infection) 12/29/2010  . HYPERLIPIDEMIA 06/10/2010  . OBESITY 03/23/2008    Past Surgical History:  Procedure Laterality Date  . CESAREAN SECTION    . DILATION AND EVACUATION N/A 01/21/2016   Procedure: Suction D & C;  Surgeon: Vena AustriaAndreas Staebler, MD;  Location: [facility] ORS;  Service: Gynecology;  Laterality: N/A;  . EYE SURGERY  2010   LASIX  . WISDOM TOOTH EXTRACTION      Prior to Admission medications   Medication Sig Start Date End Date Taking? Authorizing Provider  ferrous sulfate (FERROUSUL) 325 (65 FE) MG tablet Take 1 tablet (325 mg total) by mouth 2 (two) times daily. 01/21/16   Vena AustriaStaebler, Andreas, MD  HYDROmorphone (DILAUDID) 2 MG tablet Take 1 tablet (2 mg total) by mouth every 3 (three)  hours as needed for moderate pain or severe pain. 01/21/16   Vena AustriaStaebler, Andreas, MD  ibuprofen (ADVIL,MOTRIN) 600 MG tablet Take 1 tablet (600 mg total) by mouth every 6 (six) hours as needed (mild pain). 01/21/16   Vena AustriaStaebler, Andreas, MD  methylergonovine (METHERGINE) 0.2 MG tablet Take 1 tablet (0.2 mg total) by mouth every 6 (six) hours. 01/21/16   Vena AustriaStaebler, Andreas, MD  Norethindrone-Ethinyl Estradiol-Fe Biphas (LO LOESTRIN FE) 1 MG-10 MCG / 10 MCG tablet Take 1 tablet by mouth daily. 01/01/17 03/26/17  Vena AustriaStaebler, Andreas, MD    Allergies Oxycodone hcl  Family History  Problem Relation Age of Onset  . Hypertension Mother   . Hyperlipidemia Father     Social History Social History   Tobacco Use  . Smoking status: Never Smoker  . Smokeless tobacco: Never Used  Substance Use Topics  . Alcohol use: Yes    Comment: occasionally  . Drug use: Never    Review of Systems Constitutional: No fever/chills Eyes: No visual changes. ENT: No sore throat. Cardiovascular: Denies chest pain. Respiratory: Denies shortness of breath. Positive for left sided pleuritic pain. Gastrointestinal: No abdominal pain.  No nausea, no vomiting.  No diarrhea.   Genitourinary: Negative for dysuria. Musculoskeletal: Positive for left shoulder pain. Skin: Negative for rash. Neurological: Negative for headaches, focal weakness or numbness.  ____________________________________________   PHYSICAL EXAM:  VITAL SIGNS: ED Triage Vitals [01/30/18 1432]  Enc Vitals  Group     BP      Pulse      Resp      Temp      Temp src      SpO2      Weight 165 lb (74.8 kg)     Height 5\' 3"  (1.6 m)     Head Circumference      Peak Flow      Pain Score 5   Constitutional: Alert and oriented.  Eyes: Conjunctivae are normal.  ENT      Head: Normocephalic and atraumatic.      Nose: No congestion/rhinnorhea.      Mouth/Throat: Mucous membranes are moist.      Neck: No stridor. Hematological/Lymphatic/Immunilogical:  No cervical lymphadenopathy. Cardiovascular: Normal rate, regular rhythm.  No murmurs, rubs, or gallops.  Respiratory: Normal respiratory effort without tachypnea nor retractions. Breath sounds are clear and equal bilaterally. No wheezes/rales/rhonchi. Gastrointestinal: Soft and non tender. No rebound. No guarding.  Genitourinary: Deferred Musculoskeletal: Normal range of motion in all extremities. No lower extremity edema. Neurologic:  Normal speech and language. No gross focal neurologic deficits are appreciated.  Skin:  Skin is warm, dry and intact. No rash noted. Psychiatric: Mood and affect are normal. Speech and behavior are normal. Patient exhibits appropriate insight and judgment.  ____________________________________________    LABS (pertinent positives/negatives)  Upreg negative CMP wnl except pro 8.3 Lipase 29  ____________________________________________   EKG  None  ____________________________________________    RADIOLOGY  CT angio No PE. Inflammation around splenic flexure.    ____________________________________________   PROCEDURES  Procedures  ____________________________________________   INITIAL IMPRESSION / ASSESSMENT AND PLAN / ED COURSE  Pertinent labs & imaging results that were available during my care of the patient were reviewed by me and considered in my medical decision making (see chart for details).   Patient presented to the emergency department from walk-in clinic today because of concerns for possible PE patient d-dimer had been elevated.  CT angios did not show PE although did show some inflammation around the splenic flexure.  I discussed with patient about this.  Did discuss possibility of obtaining further imaging however at this point given lack of significant leukocytosis done at walk-in clinic and no other concerning lab findings feel like it is reasonable to treat empirically.  We did discuss return  precautions.  ____________________________________________   FINAL CLINICAL IMPRESSION(S) / ED DIAGNOSES  Final diagnoses:  Colitis     Note: This dictation was prepared with Dragon dictation. Any transcriptional errors that result from this process are unintentional     Phineas Semen, MD 01/30/18 1752

## 2018-01-30 NOTE — ED Notes (Signed)
No protocols at this time per Dr. McShane. 

## 2018-04-04 ENCOUNTER — Other Ambulatory Visit (HOSPITAL_COMMUNITY)
Admission: RE | Admit: 2018-04-04 | Discharge: 2018-04-04 | Disposition: A | Discharge status: Home / Self Care | Payer: BLUE CROSS/BLUE SHIELD | Source: Ambulatory Visit | Attending: Maternal Newborn | Admitting: Maternal Newborn

## 2018-04-04 ENCOUNTER — Ambulatory Visit (INDEPENDENT_AMBULATORY_CARE_PROVIDER_SITE_OTHER): Payer: BLUE CROSS/BLUE SHIELD | Admitting: Maternal Newborn

## 2018-04-04 ENCOUNTER — Encounter: Payer: Self-pay | Admitting: Maternal Newborn

## 2018-04-04 VITALS — BP 100/70 | Ht 63.0 in | Wt 171.0 lb

## 2018-04-04 DIAGNOSIS — Z124 Encounter for screening for malignant neoplasm of cervix: Secondary | ICD-10-CM

## 2018-04-04 DIAGNOSIS — R8290 Unspecified abnormal findings in urine: Secondary | ICD-10-CM | POA: Diagnosis not present

## 2018-04-04 DIAGNOSIS — Z01419 Encounter for gynecological examination (general) (routine) without abnormal findings: Secondary | ICD-10-CM

## 2018-04-04 DIAGNOSIS — Z3041 Encounter for surveillance of contraceptive pills: Secondary | ICD-10-CM | POA: Diagnosis not present

## 2018-04-04 LAB — POCT URINALYSIS DIPSTICK
Blood, UA: NEGATIVE
Glucose, UA: NEGATIVE
Leukocytes, UA: NEGATIVE
Nitrite, UA: NEGATIVE
PH UA: 5 (ref 5.0–8.0)
PROTEIN UA: NEGATIVE
Spec Grav, UA: 1.01 (ref 1.010–1.025)
UROBILINOGEN UA: 0.2 U/dL

## 2018-04-04 MED ORDER — NORETHIN-ETH ESTRAD-FE BIPHAS 1 MG-10 MCG / 10 MCG PO TABS: 1.0000 | ORAL_TABLET | Freq: Every day | ORAL | 3 refills | Status: DC

## 2018-04-04 NOTE — Progress Notes (Signed)
poc

## 2018-04-04 NOTE — Progress Notes (Signed)
Gynecology Annual Exam  PCP: System, Pcp Not In  Chief Complaint:  Chief Complaint  Patient presents with  . Gynecologic Exam    History of Present Illness: Patient is a 32 y.o. G1P0 presenting for an annual exam. The patient has no complaints today.   LMP: Patient's last menstrual period was 03/19/2018. Average Interval: regular, 28 days Duration of flow: several days Heavy Menses: no Clots: no Intermenstrual Bleeding: no Postcoital Bleeding: no Dysmenorrhea: no  The patient is sexually active. She currently uses OCP (estrogen/progesterone) for contraception. She denies dyspareunia.  The patient does perform self breast exams.  There is no notable family history of breast or ovarian cancer in her family.  The patient wears seatbelts: yes.   The patient has regular exercise: yes.    The patient denies current symptoms of depression.    Review of Systems  Constitutional: Negative.   HENT: Negative.   Eyes: Negative.   Respiratory: Negative for cough, shortness of breath and wheezing.   Cardiovascular: Negative for chest pain and palpitations.  Gastrointestinal: Negative for abdominal pain, nausea and vomiting.  Genitourinary: Negative for dysuria, flank pain and frequency.       Bad urine odor and some itching around urethra, concerned about UTI  Musculoskeletal: Negative.   Skin: Negative.   Neurological: Negative.   Endo/Heme/Allergies: Negative.   Psychiatric/Behavioral: Negative.   All other systems reviewed and are negative.   Past Medical History:  Past Medical History:  Diagnosis Date  . Allergy   . High cholesterol   . Hyperlipidemia   . Obesity     Past Surgical History:  Past Surgical History:  Procedure Laterality Date  . CESAREAN SECTION    . DILATION AND EVACUATION N/A 01/21/2016   Procedure: Suction D & C;  Surgeon: Vena Austria, MD;  Location: [facility] ORS;  Service: Gynecology;  Laterality: N/A;  . EYE SURGERY  2010   LASIX  . WISDOM  TOOTH EXTRACTION      Gynecologic History:  Patient's last menstrual period was 03/19/2018. Contraception: OCP (estrogen/progesterone) Last Pap: 07/13/2011. Results were: NILM   Obstetric History: G1P0  Family History:  Family History  Problem Relation Age of Onset  . Hypertension Mother   . Hyperlipidemia Father     Social History:  Social History   Socioeconomic History  . Marital status: Married    Spouse name: Not on file  . Number of children: Not on file  . Years of education: Not on file  . Highest education level: Not on file  Occupational History  . Occupation: Prime Stage manager: Archivist  Social Needs  . Financial resource strain: Not on file  . Food insecurity:    Worry: Not on file    Inability: Not on file  . Transportation needs:    Medical: Not on file    Non-medical: Not on file  Tobacco Use  . Smoking status: Never Smoker  . Smokeless tobacco: Never Used  Substance and Sexual Activity  . Alcohol use: Yes    Comment: occasionally  . Drug use: Never  . Sexual activity: Yes    Partners: Male    Birth control/protection: Pill  Lifestyle  . Physical activity:    Days per week: Not on file    Minutes per session: Not on file  . Stress: Not on file  Relationships  . Social connections:    Talks on phone: Not on file    Gets together: Not on  file    Attends religious service: Not on file    Active member of club or organization: Not on file    Attends meetings of clubs or organizations: Not on file    Relationship status: Not on file  . Intimate partner violence:    Fear of current or ex partner: Not on file    Emotionally abused: Not on file    Physically abused: Not on file    Forced sexual activity: Not on file  Other Topics Concern  . Not on file  Social History Narrative   Interval running-walks the dog          Allergies:  Allergies  Allergen Reactions  . Oxycodone Hcl Itching    Medications: Prior to  Admission medications   Medication Sig Start Date End Date Taking? Authorizing Provider  venlafaxine XR (EFFEXOR-XR) 37.5 MG 24 hr capsule TAKE 1 CAPSULE (37.5 MG) BY ORAL ROUTE ONCE DAILY WITH FOOD 12/31/17  Yes [provider]  dicyclomine (BENTYL) 20 MG tablet Take 1 tablet (20 mg total) by mouth 3 (three) times daily as needed (abdominal pain). Patient not taking: Reported on 04/04/2018 01/30/18   Phineas Semen, MD  ferrous sulfate (FERROUSUL) 325 (65 FE) MG tablet Take 1 tablet (325 mg total) by mouth 2 (two) times daily. Patient not taking: Reported on 04/04/2018 01/21/16   Vena Austria, MD  HYDROmorphone (DILAUDID) 2 MG tablet Take 1 tablet (2 mg total) by mouth every 3 (three) hours as needed for moderate pain or severe pain. Patient not taking: Reported on 04/04/2018 01/21/16   Vena Austria, MD  ibuprofen (ADVIL,MOTRIN) 600 MG tablet Take 1 tablet (600 mg total) by mouth every 6 (six) hours as needed (mild pain). Patient not taking: Reported on 04/04/2018 01/21/16   Vena Austria, MD  methylergonovine (METHERGINE) 0.2 MG tablet Take 1 tablet (0.2 mg total) by mouth every 6 (six) hours. Patient not taking: Reported on 04/04/2018 01/21/16   Vena Austria, MD  Norethindrone-Ethinyl Estradiol-Fe Biphas (LO LOESTRIN FE) 1 MG-10 MCG / 10 MCG tablet Take 1 tablet by mouth daily. 04/04/18 03/06/19  Oswaldo Conroy, CNM    Physical Exam Vitals: Blood pressure 100/70, height 5\' 3"  (1.6 m), weight 171 lb (77.6 kg), last menstrual period 03/19/2018, unknown if currently breastfeeding.  General: NAD HEENT: normocephalic, anicteric Thyroid: no enlargement, no palpable nodules Pulmonary: No increased work of breathing, CTAB Cardiovascular: RRR, distal pulses 2+ Breast: Breasts symmetrical, no tenderness, no palpable nodules or masses, no skin or nipple retraction present, no nipple discharge.  No axillary or supraclavicular lymphadenopathy. Abdomen: Soft, non-tender,  non-distended.  Umbilicus without lesions.  No hepatomegaly, splenomegaly or masses palpable. No evidence of hernia  Genitourinary:  External: Normal external female genitalia.  Normal urethral  meatus, normal Bartholin's and Skene's glands.    Vagina: Normal vaginal mucosa, no evidence of prolapse.    Cervix: Grossly normal in appearance, no bleeding  Uterus: Non-enlarged, mobile, normal contour.  No CMT  Adnexa: ovaries non-enlarged, no adnexal masses  Rectal: deferred  Lymphatic: no evidence of inguinal lymphadenopathy Extremities: no edema, erythema, or tenderness Neurologic: Grossly intact Psychiatric: mood appropriate, affect full  Assessment: 32 y.o. G1P0 routine annual exam.  Plan: Problem List Items Addressed This Visit    None    Visit Diagnoses    Abnormal urine odor    -  Primary   Relevant Orders   POCT urinalysis dipstick (Completed)   Pap smear for cervical cancer screening  Relevant Orders   Cytology - PAP   Women's annual routine gynecological examination          1) STI screening was offered and declined.  2) ASCCP guidelines and rationale discussed.  Patient opts for every 3 year screening interval.  3) Contraception - Patient is satisfied with oral contraceptives and is not having any side effects with usage. Plans to continue.  4) Routine healthcare maintenance including cholesterol, diabetes screening discussed: Declines  5) Has had some urine odor and itching around urethra. UA negative. Offered urine culture but she prefers to get this test done at her spouse's employer where she can receive it at no cost.  6) Follow up 1 year for routine annual exam.  Marcelyn Bruins, CNM 04/04/2018  4:47 PM

## 2018-04-08 LAB — CYTOLOGY - PAP
Diagnosis: NEGATIVE
HPV (WINDOPATH): NOT DETECTED

## 2018-07-26 ENCOUNTER — Ambulatory Visit (INDEPENDENT_AMBULATORY_CARE_PROVIDER_SITE_OTHER): Payer: BLUE CROSS/BLUE SHIELD | Admitting: Obstetrics and Gynecology

## 2018-07-26 ENCOUNTER — Encounter: Payer: Self-pay | Admitting: Obstetrics and Gynecology

## 2018-07-26 ENCOUNTER — Other Ambulatory Visit (HOSPITAL_COMMUNITY)
Admission: RE | Admit: 2018-07-26 | Discharge: 2018-07-26 | Disposition: A | Discharge status: Home / Self Care | Payer: BLUE CROSS/BLUE SHIELD | Source: Ambulatory Visit | Attending: Obstetrics and Gynecology | Admitting: Obstetrics and Gynecology

## 2018-07-26 VITALS — BP 112/58 | HR 106 | Wt 190.0 lb

## 2018-07-26 DIAGNOSIS — Z3A01 Less than 8 weeks gestation of pregnancy: Secondary | ICD-10-CM | POA: Insufficient documentation

## 2018-07-26 DIAGNOSIS — Z113 Encounter for screening for infections with a predominantly sexual mode of transmission: Secondary | ICD-10-CM

## 2018-07-26 DIAGNOSIS — Z3689 Encounter for other specified antenatal screening: Secondary | ICD-10-CM

## 2018-07-26 DIAGNOSIS — Z3480 Encounter for supervision of other normal pregnancy, unspecified trimester: Secondary | ICD-10-CM | POA: Diagnosis not present

## 2018-07-26 DIAGNOSIS — O34219 Maternal care for unspecified type scar from previous cesarean delivery: Secondary | ICD-10-CM | POA: Insufficient documentation

## 2018-07-26 MED ORDER — DOXYLAMINE-PYRIDOXINE 10-10 MG PO TBEC: 2.0000 | DELAYED_RELEASE_TABLET | Freq: Every day | ORAL | 5 refills | Status: DC

## 2018-07-26 NOTE — Progress Notes (Signed)
NOB 

## 2018-07-26 NOTE — Progress Notes (Signed)
New Obstetric Patient H&P    Chief Complaint: "Desires prenatal care"   History of Present Illness: Patient is a 33 y.o. [id number] Not Hispanic or Latino female, presents with amenorrhea and positive home pregnancy test. Patient's last menstrual period was 06/06/2018 (exact date). and based on her  LMP, her EDD is Estimated Date of Delivery: 03/13/19 and her EGA is [redacted]w[redacted]d. Cycles are regular monthly, and occur approximately every : 28 days.   Her last menstrual period was normal. Since her LMP she claims she has experienced nausea, breast tenderness, and fatigue. She denies vaginal bleeding. Her past medical history is noncontributory. Her prior pregnancies are notable for none  Since her LMP, she admits to the use of tobacco products  no There are cats in the home in the home  yes If yes Outdoor She admits close contact with children on a regular basis  yes  She has had chicken pox in the past yes She has had Tuberculosis exposures, symptoms, or previously tested positive for TB   no Current or past history of domestic violence. no  Genetic Screening/Teratology Counseling: (Includes patient, baby's father, or anyone in either family with:)   1. Patient's age >/= 65 at [facility]  no 2. Thalassemia ([place], [place], Mediterranean, or Asian background): MCV<80  no 3. Neural tube defect (meningomyelocele, spina bifida, anencephaly)  no 4. Congenital heart defect  no  5. Down syndrome  no 6. Tay-Sachs (Jewish, [place]  no 7. Canavan's Disease  no 8. Sickle cell disease or trait (African)  no  9. Hemophilia or other blood disorders  no  10. Muscular dystrophy  no  11. Cystic fibrosis  no  12. Huntington's Chorea  no  13. Mental retardation/autism  no 14. Other inherited genetic or chromosomal disorder  no 15. Maternal metabolic disorder (DM, PKU, etc)  no 16. Patient or FOB with a child with a birth defect not listed above no  16a. Patient or FOB with a birth defect themselves no 17.  Recurrent pregnancy loss, or stillbirth  no  18. Any medications since LMP other than prenatal vitamins (include vitamins, supplements, OTC meds, drugs, alcohol)  no 19. Any other genetic/environmental exposure to discuss  no  Infection History:   1. Lives with someone with TB or TB exposed  no  2. Patient or partner has history of genital herpes  no 3. Rash or viral illness since LMP  no 4. History of STI (GC, CT, HPV, syphilis, HIV)  no 5. History of recent travel :  no  Other pertinent information:  no     Review of Systems:10 point review of systems negative unless otherwise noted in HPI  Past Medical History:  Past Medical History:  Diagnosis Date  . Allergy   . High cholesterol   . Hyperlipidemia   . Obesity     Past Surgical History:  Past Surgical History:  Procedure Laterality Date  . CESAREAN SECTION    . DILATION AND EVACUATION N/A 01/21/2016   Procedure: Suction D & C;  Surgeon: Vena Austria, MD;  Location: [facility] ORS;  Service: Gynecology;  Laterality: N/A;  . EYE SURGERY  2010   LASIX  . WISDOM TOOTH EXTRACTION      Gynecologic History: Patient's last menstrual period was 06/06/2018 (exact date).  Obstetric History: G3P1011  Family History:  Family History  Problem Relation Age of Onset  . Hypertension Mother   . Hyperlipidemia Father     Social History:  Social History  Socioeconomic History  . Marital status: Married    Spouse name: Not on file  . Number of children: Not on file  . Years of education: Not on file  . Highest education level: Not on file  Occupational History  . Occupation: Prime Stage manager: Archivist  Social Needs  . Financial resource strain: Not on file  . Food insecurity:    Worry: Not on file    Inability: Not on file  . Transportation needs:    Medical: Not on file    Non-medical: Not on file  Tobacco Use  . Smoking status: Never Smoker  . Smokeless tobacco: Never Used  Substance and Sexual  Activity  . Alcohol use: Yes    Comment: occasionally  . Drug use: Never  . Sexual activity: Yes    Partners: Male    Birth control/protection: None  Lifestyle  . Physical activity:    Days per week: Not on file    Minutes per session: Not on file  . Stress: Not on file  Relationships  . Social connections:    Talks on phone: Not on file    Gets together: Not on file    Attends religious service: Not on file    Active member of club or organization: Not on file    Attends meetings of clubs or organizations: Not on file    Relationship status: Not on file  . Intimate partner violence:    Fear of current or ex partner: Not on file    Emotionally abused: Not on file    Physically abused: Not on file    Forced sexual activity: Not on file  Other Topics Concern  . Not on file  Social History Narrative   Interval running-walks the dog          Allergies:  Allergies  Allergen Reactions  . Oxycodone Hcl Itching    Medications: Prior to Admission medications   Medication Sig Start Date End Date Taking? Authorizing Provider  venlafaxine XR (EFFEXOR-XR) 37.5 MG 24 hr capsule TAKE 1 CAPSULE (37.5 MG) BY ORAL ROUTE ONCE DAILY WITH FOOD 12/31/17   [provider]    Physical Exam Vitals: Blood pressure (!) 112/58, pulse (!) 106, weight 190 lb (86.2 kg), last menstrual period 06/06/2018, unknown if currently breastfeeding.  General: NAD HEENT: normocephalic, anicteric Thyroid: no enlargement, no palpable nodules Pulmonary: No increased work of breathing, CTAB Cardiovascular: RRR, distal pulses 2+ Abdomen: NABS, soft, non-tender, non-distended.  Umbilicus without lesions.  No hepatomegaly, splenomegaly or masses palpable. No evidence of hernia  Genitourinary:  External: Normal external female genitalia.  Normal urethral meatus, normal  Bartholin's and Skene's glands.    Vagina: Normal vaginal mucosa, no evidence of prolapse.    Cervix: Grossly normal in appearance, no  bleeding  Uterus:  Non-enlarged, mobile, normal contour.  No CMT  Adnexa: ovaries non-enlarged, no adnexal masses  Rectal: deferred Extremities: no edema, erythema, or tenderness Neurologic: Grossly intact Psychiatric: mood appropriate, affect full   Assessment: 33 y.o. G3P1011 at [redacted]w[redacted]d presenting to initiate prenatal care  Plan: 1) Avoid alcoholic beverages. 2) Patient encouraged not to smoke.  3) Discontinue the use of all non-medicinal drugs and chemicals.  4) Take prenatal vitamins daily.  5) Nutrition, food safety (fish, cheese advisories, and high nitrite foods) and exercise discussed. 6) Hospital and practice style discussed with cross coverage system.  7) Genetic Screening, such as with 1st Trimester Screening, cell free fetal DNA, AFP testing,  and Ultrasound, as well as with amniocentesis and CVS as appropriate, is discussed with patient. At the conclusion of today's visit patient requested genetic testing 8) Patient is asked about travel to areas at risk for the Zika virus, and counseled to avoid travel and exposure to mosquitoes or sexual partners who may have themselves been exposed to the virus. Testing is discussed, and will be ordered as appropriate.   Vena AustriaAndreas Rasheen Schewe, MD, Evern CoreFACOG [facility], [facility] 07/26/2018, 2:29 PM

## 2018-07-27 LAB — RPR+RH+ABO+RUB AB+AB SCR+CB...
Antibody Screen: NEGATIVE
HEMATOCRIT: 41 % (ref 34.0–46.6)
HEMOGLOBIN: 14.2 g/dL (ref 11.1–15.9)
HIV Screen 4th Generation wRfx: NONREACTIVE
Hepatitis B Surface Ag: NEGATIVE
MCH: 31 pg (ref 26.6–33.0)
MCHC: 34.6 g/dL (ref 31.5–35.7)
MCV: 90 fL (ref 79–97)
Platelets: 322 10*3/uL (ref 150–450)
RBC: 4.58 x10E6/uL (ref 3.77–5.28)
RDW: 12.5 % (ref 11.7–15.4)
RH TYPE: POSITIVE
RPR Ser Ql: NONREACTIVE
RUBELLA: 5.06 {index} (ref >0.99)
VARICELLA: 182 {index} (ref >165)
WBC: 10.5 10*3/uL (ref 3.4–10.8)

## 2018-07-29 ENCOUNTER — Telehealth: Payer: Self-pay

## 2018-07-29 NOTE — Telephone Encounter (Signed)
Pt saw AMS last Fri and rx for zoloft was supposed to be called in but it's not at the pharm.  Pt would like to p/u today.  [phone]  Courtesy call to pt to let her know AMS is not in the office today; will be back tomorrow.  Pharm is correct in chart.

## 2018-07-30 ENCOUNTER — Other Ambulatory Visit: Payer: Self-pay | Admitting: Obstetrics and Gynecology

## 2018-07-30 ENCOUNTER — Encounter: Payer: Self-pay | Admitting: Obstetrics and Gynecology

## 2018-07-30 DIAGNOSIS — Z3480 Encounter for supervision of other normal pregnancy, unspecified trimester: Secondary | ICD-10-CM | POA: Insufficient documentation

## 2018-07-30 LAB — CERVICOVAGINAL ANCILLARY ONLY
CHLAMYDIA, DNA PROBE: NEGATIVE
NEISSERIA GONORRHEA: NEGATIVE

## 2018-07-30 LAB — URINE CULTURE

## 2018-07-30 MED ORDER — SERTRALINE HCL 50 MG PO TABS: 50.0000 mg | ORAL_TABLET | Freq: Every day | ORAL | 6 refills | Status: DC

## 2018-07-30 NOTE — Telephone Encounter (Signed)
Called in [facility] [place]

## 2018-07-31 ENCOUNTER — Other Ambulatory Visit: Payer: Self-pay | Admitting: Obstetrics and Gynecology

## 2018-07-31 DIAGNOSIS — N390 Urinary tract infection, site not specified: Principal | ICD-10-CM

## 2018-07-31 DIAGNOSIS — B9620 Unspecified Escherichia coli [E. coli] as the cause of diseases classified elsewhere: Secondary | ICD-10-CM

## 2018-07-31 MED ORDER — NITROFURANTOIN MONOHYD MACRO 100 MG PO CAPS: 100.0000 mg | ORAL_CAPSULE | Freq: Two times a day (BID) | ORAL | 1 refills | Status: DC

## 2018-07-31 NOTE — Telephone Encounter (Signed)
Pt aware.

## 2018-08-07 ENCOUNTER — Ambulatory Visit (INDEPENDENT_AMBULATORY_CARE_PROVIDER_SITE_OTHER): Payer: BLUE CROSS/BLUE SHIELD

## 2018-08-07 ENCOUNTER — Ambulatory Visit (INDEPENDENT_AMBULATORY_CARE_PROVIDER_SITE_OTHER): Payer: BLUE CROSS/BLUE SHIELD | Admitting: Obstetrics and Gynecology

## 2018-08-07 VITALS — BP 120/80 | Wt 187.0 lb

## 2018-08-07 DIAGNOSIS — Z3A08 8 weeks gestation of pregnancy: Secondary | ICD-10-CM

## 2018-08-07 DIAGNOSIS — N8312 Corpus luteum cyst of left ovary: Secondary | ICD-10-CM | POA: Diagnosis not present

## 2018-08-07 DIAGNOSIS — Z3A09 9 weeks gestation of pregnancy: Secondary | ICD-10-CM

## 2018-08-07 DIAGNOSIS — Z3480 Encounter for supervision of other normal pregnancy, unspecified trimester: Secondary | ICD-10-CM

## 2018-08-07 DIAGNOSIS — Z3A01 Less than 8 weeks gestation of pregnancy: Secondary | ICD-10-CM

## 2018-08-07 DIAGNOSIS — Z3689 Encounter for other specified antenatal screening: Secondary | ICD-10-CM

## 2018-08-07 DIAGNOSIS — O34219 Maternal care for unspecified type scar from previous cesarean delivery: Secondary | ICD-10-CM

## 2018-08-07 DIAGNOSIS — O3481 Maternal care for other abnormalities of pelvic organs, first trimester: Secondary | ICD-10-CM | POA: Diagnosis not present

## 2018-08-07 DIAGNOSIS — B9620 Unspecified Escherichia coli [E. coli] as the cause of diseases classified elsewhere: Secondary | ICD-10-CM

## 2018-08-07 DIAGNOSIS — N390 Urinary tract infection, site not specified: Secondary | ICD-10-CM

## 2018-08-07 DIAGNOSIS — O9989 Other specified diseases and conditions complicating pregnancy, childbirth and the puerperium: Secondary | ICD-10-CM

## 2018-08-07 LAB — POCT URINALYSIS DIPSTICK OB: Glucose, UA: NEGATIVE

## 2018-08-07 MED ORDER — ONDANSETRON 4 MG PO TBDP: 4.0000 mg | ORAL_TABLET | Freq: Four times a day (QID) | ORAL | 0 refills | Status: DC | PRN

## 2018-08-07 NOTE — Progress Notes (Signed)
Routine Prenatal Care Visit  Subjective  Roberta Coleman is a 33 y.o. G3P1011 at 2976w6d being seen today for ongoing prenatal care.  She is currently monitored for the following issues for this low-risk pregnancy and has HYPERLIPIDEMIA; OBESITY; History of cesarean delivery, currently pregnant; Supervision of other normal pregnancy, antepartum; and E. coli UTI on their problem list.  ----------------------------------------------------------------------------------- Patient reports no complaints.    . Vag. Bleeding: None.   . Denies leaking of fluid.  ----------------------------------------------------------------------------------- The following portions of the patient's history were reviewed and updated as appropriate: allergies, current medications, past family history, past medical history, past social history, past surgical history and problem list. Problem list updated.   Objective  Blood pressure 120/80, weight 187 lb (84.8 kg), last menstrual period 06/06/2018, unknown if currently breastfeeding. Pregravid weight 180 lb (81.6 kg) Total Weight Gain 7 lb (3.175 kg) Urinalysis:      Fetal Status:           General:  Alert, oriented and cooperative. Patient is in no acute distress.  Skin: Skin is warm and dry. No rash noted.   Cardiovascular: Normal heart rate noted  Respiratory: Normal respiratory effort, no problems with respiration noted  Abdomen: Soft, gravid, appropriate for gestational age. Pain/Pressure: Absent     Pelvic:  Cervical exam deferred        Extremities: Normal range of motion.     ental Status: Normal mood and affect. Normal behavior. Normal judgment and thought content.   [place] Ob Comp Less 14 Wks  Result Date: 08/07/2018 Patient Name: Johney Maineen L Bart DOB: [birth year] MRN: [id number] ULTRASOUND REPORT Location: [facility] Date of Service: 08/07/2018 Indications:dating Findings: Mason JimSingleton intrauterine pregnancy is visualized with a CRL consistent with 214w0d  gestation, giving an (U/S) EDD of 03/12/19. The (U/S) EDD is consistent with the clinically established EDD of 03/13/19. FHR: 174 BPM CRL measurement: 23.1 mm Yolk sac is visualized and appears normal and early anatomy is normal. Amnion: visualized and appears normal Right Ovary is normal in appearance. Left Ovary is normal appearance. Corpus luteal cyst:  Left ovary Survey of the adnexa demonstrates no adnexal masses. There is no free peritoneal fluid in the cul de sac. Impression: 1. 7514w0d Viable Singleton Intrauterine pregnancy by U/S. 2. (U/S) EDD is consistent with Clinically established EDD of 03/13/19. Recommendations: 1.Clinical correlation with the patient's History and Physical Exam. Darlina GuysAbby M Clarke, RDMS RVT There is a viable singleton gestation.  The fetal biometry correlates with established dating. Detailed evaluation of the fetal anatomy is precluded by early gestational age.  It must be noted that a normal ultrasound particular at this early gestational age is unable to rule out fetal aneuploidy, risk of first trimester miscarriage, or anatomic birth defects. Vena AustriaAndreas Yaminah Clayborn, MD, Evern CoreFACOG [facility], [facility] 08/07/2018, 11:04 AM     Assessment   32 y.o. G3P1011 at 4776w6d by  03/13/2019, by Last Menstrual Period presenting for routine prenatal visit  Plan   Pregnancy#3 Problems (from 06/06/18 to present)    Problem Noted Resolved   E. coli UTI 07/31/2018 by Vena AustriaStaebler, Shubham Thackston, MD No   Overview Signed 07/31/2018  8:23 AM by Vena AustriaStaebler, Micha Dosanjh, MD    On NOB          Gestational age appropriate obstetric precautions including but not limited to vaginal bleeding, contractions, leaking of fluid and fetal movement were reviewed in detail with the patient.    Return in about 3 weeks (around 08/28/2018) for ROB.  Vena Austria, MD, Evern Core [facility], [facility] 08/07/2018, 11:04 AM

## 2018-08-09 LAB — URINE CULTURE

## 2018-08-23 ENCOUNTER — Other Ambulatory Visit: Payer: Self-pay | Admitting: Obstetrics and Gynecology

## 2018-08-23 NOTE — Telephone Encounter (Signed)
ROB is with Marcelyn Bruins on 2/19

## 2018-08-28 ENCOUNTER — Encounter: Payer: BLUE CROSS/BLUE SHIELD | Admitting: Maternal Newborn

## 2018-08-29 ENCOUNTER — Ambulatory Visit (INDEPENDENT_AMBULATORY_CARE_PROVIDER_SITE_OTHER): Payer: BLUE CROSS/BLUE SHIELD | Admitting: Certified Nurse Midwife

## 2018-08-29 ENCOUNTER — Encounter: Payer: Self-pay | Admitting: Certified Nurse Midwife

## 2018-08-29 VITALS — BP 118/74 | Wt 188.0 lb

## 2018-08-29 DIAGNOSIS — Z1379 Encounter for other screening for genetic and chromosomal anomalies: Secondary | ICD-10-CM

## 2018-08-29 DIAGNOSIS — Z3480 Encounter for supervision of other normal pregnancy, unspecified trimester: Secondary | ICD-10-CM

## 2018-08-29 DIAGNOSIS — F419 Anxiety disorder, unspecified: Secondary | ICD-10-CM | POA: Insufficient documentation

## 2018-08-29 DIAGNOSIS — Z3A12 12 weeks gestation of pregnancy: Secondary | ICD-10-CM

## 2018-08-29 DIAGNOSIS — O99210 Obesity complicating pregnancy, unspecified trimester: Secondary | ICD-10-CM

## 2018-08-29 DIAGNOSIS — O99211 Obesity complicating pregnancy, first trimester: Secondary | ICD-10-CM

## 2018-08-29 DIAGNOSIS — Z6833 Body mass index (BMI) 33.0-33.9, adult: Secondary | ICD-10-CM

## 2018-08-29 DIAGNOSIS — Z131 Encounter for screening for diabetes mellitus: Secondary | ICD-10-CM

## 2018-08-29 LAB — POCT URINALYSIS DIPSTICK OB
Glucose, UA: NEGATIVE
POC,PROTEIN,UA: NEGATIVE

## 2018-08-29 NOTE — Progress Notes (Signed)
No vb. No lof.  

## 2018-08-29 NOTE — Progress Notes (Signed)
ROB at 12 weeks: Doing well. Mild nausea. No vaginal bleeding. Desires cell free DNA testing and MSAFP testing this pregnancy. FCA: 150s with DT MaterniT21 test today MSAFP and 1 hour GTT in 4 weeks Farrel Conners, CNM

## 2018-09-03 LAB — MATERNIT21 PLUS CORE+SCA
CHROMOSOME 18: NEGATIVE
Chromosome 13: NEGATIVE
Chromosome 21: NEGATIVE
Fetal Fraction: 3
Y Chromosome: NOT DETECTED

## 2018-09-05 ENCOUNTER — Encounter: Payer: Self-pay | Admitting: Certified Nurse Midwife

## 2018-09-27 ENCOUNTER — Other Ambulatory Visit: Payer: BLUE CROSS/BLUE SHIELD

## 2018-09-27 ENCOUNTER — Ambulatory Visit (INDEPENDENT_AMBULATORY_CARE_PROVIDER_SITE_OTHER): Payer: BLUE CROSS/BLUE SHIELD | Admitting: Obstetrics and Gynecology

## 2018-09-27 ENCOUNTER — Other Ambulatory Visit: Payer: Self-pay

## 2018-09-27 VITALS — BP 124/66 | Wt 195.0 lb

## 2018-09-27 DIAGNOSIS — Z3480 Encounter for supervision of other normal pregnancy, unspecified trimester: Secondary | ICD-10-CM

## 2018-09-27 DIAGNOSIS — Z131 Encounter for screening for diabetes mellitus: Secondary | ICD-10-CM

## 2018-09-27 DIAGNOSIS — O34219 Maternal care for unspecified type scar from previous cesarean delivery: Secondary | ICD-10-CM

## 2018-09-27 DIAGNOSIS — Z3A16 16 weeks gestation of pregnancy: Secondary | ICD-10-CM

## 2018-09-27 DIAGNOSIS — O99210 Obesity complicating pregnancy, unspecified trimester: Secondary | ICD-10-CM

## 2018-09-27 DIAGNOSIS — Z363 Encounter for antenatal screening for malformations: Secondary | ICD-10-CM

## 2018-09-27 DIAGNOSIS — Z6833 Body mass index (BMI) 33.0-33.9, adult: Secondary | ICD-10-CM

## 2018-09-27 LAB — POCT URINALYSIS DIPSTICK OB
Glucose, UA: NEGATIVE
POC,PROTEIN,UA: NEGATIVE

## 2018-09-27 NOTE — Progress Notes (Signed)
    Routine Prenatal Care Visit  Subjective  Roberta Coleman is a 33 y.o. G3P1011 at [redacted]w[redacted]d being seen today for ongoing prenatal care.  She is currently monitored for the following issues for this low-risk pregnancy and has HYPERLIPIDEMIA; OBESITY; History of cesarean delivery, currently pregnant; Supervision of other normal pregnancy, antepartum; E. coli UTI; and Anxiety on their problem list.  ----------------------------------------------------------------------------------- Patient reports no complaints.   Contractions: Not present. Vag. Bleeding: None.  Movement: Absent. Denies leaking of fluid.  ----------------------------------------------------------------------------------- The following portions of the patient's history were reviewed and updated as appropriate: allergies, current medications, past family history, past medical history, past social history, past surgical history and problem list. Problem list updated.   Objective  Blood pressure 124/66, weight 195 lb (88.5 kg), last menstrual period 06/06/2018, unknown if currently breastfeeding. Pregravid weight 180 lb (81.6 kg) Total Weight Gain 15 lb (6.804 kg) Urinalysis:      Fetal Status: Fetal Heart Rate (bpm): 145   Movement: Absent     General:  Alert, oriented and cooperative. Patient is in no acute distress.  Skin: Skin is warm and dry. No rash noted.   Cardiovascular: Normal heart rate noted  Respiratory: Normal respiratory effort, no problems with respiration noted  Abdomen: Soft, gravid, appropriate for gestational age. Pain/Pressure: Absent     Pelvic:  Cervical exam deferred        Extremities: Normal range of motion.     ental Status: Normal mood and affect. Normal behavior. Normal judgment and thought content.     Assessment   33 y.o. G3P1011 at [redacted]w[redacted]d by  03/13/2019, by Last Menstrual Period presenting for routine prenatal visit  Plan   Pregnancy#3 Problems (from 06/06/18 to present)    Problem Noted  Resolved   E. coli UTI 07/31/2018 by Vena Austria, MD No   Overview Signed 07/31/2018  8:23 AM by Vena Austria, MD    On NOB      Supervision of other normal pregnancy, antepartum 07/30/2018 by Vena Austria, MD No   Overview Addendum 08/29/2018  9:12 AM by Farrel Conners, CNM    Clinic [facility] Prenatal Labs  Dating LMP=9wk Blood type: B pos  Genetic Screen 1 Screen:    AFP:     Quad:     NIPS: SMA:  FragileX:  CF: Antibody: negative  Anatomic [place]  Rubella: Immune Varicella: Immune  GTT  RPR: NR  Rhogam N/A HBsAg: negative  TDaP vaccine                       Flu Shot: HIV: negative  Baby Food                                GBS:   Contraception  Pap:  Pelvis Tested     CS/VBAC ?   Support Person Jareth Mammenga              Gestational age appropriate obstetric precautions including but not limited to vaginal bleeding, contractions, leaking of fluid and fetal movement were reviewed in detail with the patient.   - early 1-hr today  Return in about 4 weeks (around 10/25/2018) for rob and anatomy scan.  Vena Austria, MD, Evern Core [facility], [facility] 09/27/2018, 3:18 PM

## 2018-09-27 NOTE — Progress Notes (Signed)
ROB Early GTT 

## 2018-09-28 LAB — GLUCOSE, 1 HOUR GESTATIONAL: GESTATIONAL DIABETES SCREEN: 122 mg/dL (ref 65–139)

## 2018-10-28 ENCOUNTER — Ambulatory Visit (INDEPENDENT_AMBULATORY_CARE_PROVIDER_SITE_OTHER): Payer: BLUE CROSS/BLUE SHIELD

## 2018-10-28 ENCOUNTER — Other Ambulatory Visit: Payer: Self-pay

## 2018-10-28 ENCOUNTER — Ambulatory Visit (INDEPENDENT_AMBULATORY_CARE_PROVIDER_SITE_OTHER): Payer: BLUE CROSS/BLUE SHIELD | Admitting: Obstetrics and Gynecology

## 2018-10-28 VITALS — BP 120/76 | Wt 198.0 lb

## 2018-10-28 DIAGNOSIS — O34219 Maternal care for unspecified type scar from previous cesarean delivery: Secondary | ICD-10-CM

## 2018-10-28 DIAGNOSIS — Z3480 Encounter for supervision of other normal pregnancy, unspecified trimester: Secondary | ICD-10-CM

## 2018-10-28 DIAGNOSIS — Z363 Encounter for antenatal screening for malformations: Secondary | ICD-10-CM | POA: Diagnosis not present

## 2018-10-28 DIAGNOSIS — Z3A20 20 weeks gestation of pregnancy: Secondary | ICD-10-CM

## 2018-10-28 LAB — POCT URINALYSIS DIPSTICK OB
Glucose, UA: NEGATIVE
POC,PROTEIN,UA: NEGATIVE

## 2018-10-28 NOTE — Progress Notes (Signed)
ROB Lymphnode on right arm hurting Anatomy Scan/ It is a GIRL!!

## 2018-10-28 NOTE — Progress Notes (Signed)
Routine Prenatal Care Visit  Subjective  Roberta Coleman is a 33 y.o. G3P1011 at 5025w4d being seen today for ongoing prenatal care.  She is currently monitored for the following issues for this low-risk pregnancy and has HYPERLIPIDEMIA; OBESITY; History of cesarean delivery, currently pregnant; Supervision of other normal pregnancy, antepartum; E. coli UTI; and Anxiety on their problem list.  ----------------------------------------------------------------------------------- Patient reports no complaints.   Contractions: Not present. Vag. Bleeding: None.  Movement: Present. Denies leaking of fluid.  ----------------------------------------------------------------------------------- The following portions of the patient's history were reviewed and updated as appropriate: allergies, current medications, past family history, past medical history, past social history, past surgical history and problem list. Problem list updated.   Objective  Blood pressure 120/76, weight 198 lb (89.8 kg), last menstrual period 06/06/2018, unknown if currently breastfeeding. Pregravid weight 180 lb (81.6 kg) Total Weight Gain 18 lb (8.165 kg) Urinalysis:      Fetal Status: Fetal Heart Rate (bpm): 150   Movement: Present     General:  Alert, oriented and cooperative. Patient is in no acute distress.  Skin: Skin is warm and dry. No rash noted.   Cardiovascular: Normal heart rate noted  Respiratory: Normal respiratory effort, no problems with respiration noted  Abdomen: Soft, gravid, appropriate for gestational age. Pain/Pressure: Absent     Pelvic:  Cervical exam deferred        Extremities: Normal range of motion.     ental Status: Normal mood and affect. Normal behavior. Normal judgment and thought content.   [place] Ob Comp + 14 Wk  Result Date: 10/28/2018 Patient Name: Roberta Coleman DOB: 13-Jan-1986 MRN: [id number] ULTRASOUND REPORT Location: [facility] Date of Service: 10/28/2018 Indications:Anatomy  Ultrasound Findings: Mason JimSingleton intrauterine pregnancy is visualized with FHR at 132 BPM. Biometrics give an (U/S) Gestational age of 1438w6d and an (U/S) EDD of 03/11/2019; this correlates with the clinically established Estimated Date of Delivery: 03/13/19 Fetal presentation is Breech. EFW: 437 g (15 oz). Placenta: posterior. Grade: 1 AFI: subjectively normal. Anatomic survey is complete and normal; Gender - female.  Right Ovary is normal in appearance. Left Ovary is normal appearance. Survey of the adnexa demonstrates no adnexal masses. There is no free peritoneal fluid in the cul de sac. Impression: 1. 1225w4d Viable Singleton Intrauterine pregnancy by U/S. 2. (U/S) EDD is consistent with Clinically established Estimated Date of Delivery: 03/13/19 . 3. Normal Anatomy Scan Recommendations: 1.Clinical correlation with the patient's History and Physical Exam. Clydene LamingJenine M Coleman, RDMS  There is a singleton gestation with subjectively normal amniotic fluid volume. The fetal biometry correlates with established dating. Detailed evaluation of the fetal anatomy was performed.The fetal anatomical survey appears within normal limits within the resolution of ultrasound as described above.  It must be noted that a normal ultrasound is unable to rule out fetal aneuploidy.  Vena AustriaAndreas Arlone Lenhardt, MD, Evern CoreFACOG [facility], [facility] 10/28/2018, 2:57 PM     Assessment   33 y.o. G3P1011 at 5725w4d by  03/13/2019, by Last Menstrual Period presenting for routine prenatal visit  Plan   Pregnancy#3 Problems (from 06/06/18 to present)    Problem Noted Resolved   E. coli UTI 07/31/2018 by Vena AustriaStaebler, Gad Aymond, MD No   Overview Signed 07/31/2018  8:23 AM by Vena AustriaStaebler, Hue Frick, MD    On NOB      Supervision of other normal pregnancy, antepartum 07/30/2018 by Vena AustriaStaebler, Kentrail Shew, MD No   Overview Addendum 08/29/2018  9:12 AM by Farrel ConnersGutierrez, Colleen, CNM  Clinic [facility] Prenatal Labs  Dating LMP=9wk Blood type: B pos  Genetic  Screen NIPS: Normal XX Antibody: negative  Anatomic [place] Complete Rubella: Immune Varicella: Immune  GTT Early 122 RPR: NR  Rhogam N/A HBsAg: negative  TDaP vaccine                       Flu Shot: HIV: negative  Baby Food                                GBS:   Contraception  Pap: NIL HPV negative  Pelvis Tested     CS/VBAC ?   Support Person Roberta Coleman              Gestational age appropriate obstetric precautions including but not limited to vaginal bleeding, contractions, leaking of fluid and fetal movement were reviewed in detail with the patient.    Return in about 4 weeks (around 11/25/2018) for 4 week Telephone ROB, 8 weeks ROB and 28 week labs.  Vena Austria, MD, Evern Core [facility], [facility] 10/28/2018, 3:17 PM

## 2018-11-28 ENCOUNTER — Ambulatory Visit (INDEPENDENT_AMBULATORY_CARE_PROVIDER_SITE_OTHER): Payer: BLUE CROSS/BLUE SHIELD | Admitting: Obstetrics and Gynecology

## 2018-11-28 ENCOUNTER — Other Ambulatory Visit: Payer: Self-pay

## 2018-11-28 DIAGNOSIS — Z3A25 25 weeks gestation of pregnancy: Secondary | ICD-10-CM

## 2018-11-28 DIAGNOSIS — Z3480 Encounter for supervision of other normal pregnancy, unspecified trimester: Secondary | ICD-10-CM

## 2018-11-28 DIAGNOSIS — O34219 Maternal care for unspecified type scar from previous cesarean delivery: Secondary | ICD-10-CM

## 2018-11-28 NOTE — Progress Notes (Signed)
   I connected with Roberta Coleman on 11/28/18 at 11:30 AM EDT by telephone and verified that I am speaking with the correct person using two identifiers.   I discussed the limitations, risks, security and privacy concerns of performing an evaluation and management service by telephone and the availability of in person appointments. I also discussed with the patient that there may be a patient responsible charge related to this service. The patient expressed understanding and agreed to proceed.  The patient was at home I spoke with the patient from my workstation phone The names of people involved in this encounter were: Martesha L Yackel , and [employer]  Routine Prenatal Care Visit  Subjective  Roberta Coleman is a 33 y.o. G3P1011 at [redacted]w[redacted]d being seen today for ongoing prenatal care.  She is currently monitored for the following issues for this low-risk pregnancy and has HYPERLIPIDEMIA; OBESITY; History of cesarean delivery, currently pregnant; Supervision of other normal pregnancy, antepartum; E. coli UTI; and Anxiety on their problem list.  ----------------------------------------------------------------------------------- Patient reports no complaints.   Contractions: Not present. Vag. Bleeding: None.  Movement: Present. Denies leaking of fluid.  ----------------------------------------------------------------------------------- The following portions of the patient's history were reviewed and updated as appropriate: allergies, current medications, past family history, past medical history, past social history, past surgical history and problem list. Problem list updated.   Objective  Last menstrual period 06/06/2018, unknown if currently breastfeeding. Pregravid weight 180 lb (81.6 kg) Total Weight Gain 18 lb (8.165 kg) Urinalysis:      Fetal Status:     Movement: Present     No physical exam as this was a remote telephone visit to promote social distancing during the  current COVID-19 Pandemic   Assessment   33 y.o. G3P1011 at [redacted]w[redacted]d by  03/13/2019, by Last Menstrual Period presenting for routine prenatal visit  Plan   Pregnancy#3 Problems (from 06/06/18 to present)    Problem Noted Resolved   E. coli UTI 07/31/2018 by Vena Austria, MD No   Overview Signed 07/31/2018  8:23 AM by Vena Austria, MD    On NOB      Supervision of other normal pregnancy, antepartum 07/30/2018 by Vena Austria, MD No   Overview Addendum 10/28/2018  3:00 PM by Vena Austria, MD    Clinic [facility] Prenatal Labs  Dating LMP=9wk Blood type: B pos  Genetic Screen NIPS: Normal XX Antibody: negative  Anatomic [place] Complete Rubella: Immune Varicella: Immune  GTT Early 122 RPR: NR  Rhogam N/A HBsAg: negative  TDaP vaccine                       Flu Shot: HIV: negative  Baby Food                                GBS:   Contraception  Pap: 04/04/2018 NIL HPV negative  Pelvis Tested     CS/VBAC ?   Support Person Asjha Samuel              Gestational age appropriate obstetric precautions including but not limited to vaginal bleeding, contractions, leaking of fluid and fetal movement were reviewed in detail with the patient.    Telephone time 3:42 min  Return in about 3 weeks (around 12/19/2018) for ROB and 28 week labs.  Vena Austria, MD, [facility], [facility] 11/28/2018, 11:46 AM

## 2018-11-28 NOTE — Progress Notes (Signed)
ROB televisit

## 2018-12-27 ENCOUNTER — Ambulatory Visit (INDEPENDENT_AMBULATORY_CARE_PROVIDER_SITE_OTHER): Payer: BC Managed Care – PPO | Admitting: Obstetrics and Gynecology

## 2018-12-27 ENCOUNTER — Other Ambulatory Visit: Payer: BC Managed Care – PPO

## 2018-12-27 ENCOUNTER — Other Ambulatory Visit: Payer: Self-pay

## 2018-12-27 VITALS — BP 130/66 | Wt 204.0 lb

## 2018-12-27 DIAGNOSIS — Z3A29 29 weeks gestation of pregnancy: Secondary | ICD-10-CM

## 2018-12-27 DIAGNOSIS — Z3480 Encounter for supervision of other normal pregnancy, unspecified trimester: Secondary | ICD-10-CM

## 2018-12-27 DIAGNOSIS — O34219 Maternal care for unspecified type scar from previous cesarean delivery: Secondary | ICD-10-CM

## 2018-12-27 NOTE — Progress Notes (Signed)
    Routine Prenatal Care Visit  Subjective  Roberta Coleman is a 33 y.o. G3P1011 at [redacted]w[redacted]d being seen today for ongoing prenatal care.  She is currently monitored for the following issues for this high-risk pregnancy and has HYPERLIPIDEMIA; OBESITY; History of cesarean delivery, currently pregnant; Supervision of other normal pregnancy, antepartum; E. coli UTI; and Anxiety on their problem list.  ----------------------------------------------------------------------------------- Patient reports no complaints.   Contractions: Not present. Vag. Bleeding: None.  Movement: Present. Denies leaking of fluid.  ----------------------------------------------------------------------------------- The following portions of the patient's history were reviewed and updated as appropriate: allergies, current medications, past family history, past medical history, past social history, past surgical history and problem list. Problem list updated.   Objective  Blood pressure 130/66, weight 204 lb (92.5 kg), last menstrual period 06/06/2018, unknown if currently breastfeeding. Pregravid weight 180 lb (81.6 kg) Total Weight Gain 24 lb (10.9 kg) Urinalysis:      Fetal Status: Fetal Heart Rate (bpm): 145 Fundal Height: 30 cm Movement: Present     General:  Alert, oriented and cooperative. Patient is in no acute distress.  Skin: Skin is warm and dry. No rash noted.   Cardiovascular: Normal heart rate noted  Respiratory: Normal respiratory effort, no problems with respiration noted  Abdomen: Soft, gravid, appropriate for gestational age. Pain/Pressure: Absent     Pelvic:  Cervical exam deferred        Extremities: Normal range of motion.     ental Status: Normal mood and affect. Normal behavior. Normal judgment and thought content.     Assessment   33 y.o. G3P1011 at [redacted]w[redacted]d by  03/13/2019, by Last Menstrual Period presenting for routine prenatal visit  Plan   Pregnancy#3 Problems (from 06/06/18 to  present)    Problem Noted Resolved   E. coli UTI 07/31/2018 by Malachy Mood, MD No   Overview Signed 07/31/2018  8:23 AM by Malachy Mood, MD    On NOB      Supervision of other normal pregnancy, antepartum 07/30/2018 by Malachy Mood, MD No   Overview Addendum 10/28/2018  3:00 PM by Malachy Mood, MD    Clinic [facility] Prenatal Labs  Dating LMP=9wk Blood type: B pos  Genetic Screen NIPS: Normal XX Antibody: negative  Anatomic [place] Complete Rubella: Immune Varicella: Immune  GTT Early 122 RPR: NR  Rhogam N/A HBsAg: negative  TDaP vaccine                       Flu Shot: HIV: negative  Baby Food                                GBS:   Contraception  Pap: 04/04/2018 NIL HPV negative  Pelvis Tested     CS/VBAC ?   Support Person Romina Divirgilio              Gestational age appropriate obstetric precautions including but not limited to vaginal bleeding, contractions, leaking of fluid and fetal movement were reviewed in detail with the patient.    Return in about 2 weeks (around 01/10/2019) for ROB.  Malachy Mood, MD, [facility], [facility] 12/27/2018, 3:32 PM

## 2018-12-27 NOTE — Progress Notes (Signed)
ROB 28 week labs 

## 2018-12-28 LAB — 28 WEEK RH+PANEL
Basophils Absolute: 0 10*3/uL (ref 0.0–0.2)
Basos: 0 %
EOS (ABSOLUTE): 0.1 10*3/uL (ref 0.0–0.4)
Eos: 1 %
Gestational Diabetes Screen: 90 mg/dL (ref 65–139)
HIV Screen 4th Generation wRfx: NONREACTIVE
Hematocrit: 36.1 % (ref 34.0–46.6)
Hemoglobin: 12.7 g/dL (ref 11.1–15.9)
Immature Grans (Abs): 0 10*3/uL (ref 0.0–0.1)
Immature Granulocytes: 0 %
Lymphocytes Absolute: 1.6 10*3/uL (ref 0.7–3.1)
Lymphs: 16 %
MCH: 31.4 pg (ref 26.6–33.0)
MCHC: 35.2 g/dL (ref 31.5–35.7)
MCV: 89 fL (ref 79–97)
Monocytes Absolute: 0.8 10*3/uL (ref 0.1–0.9)
Monocytes: 8 %
Neutrophils Absolute: 7.5 10*3/uL — ABNORMAL HIGH (ref 1.4–7.0)
Neutrophils: 75 %
Platelets: 210 10*3/uL (ref 150–450)
RBC: 4.05 x10E6/uL (ref 3.77–5.28)
RDW: 13.7 % (ref 11.7–15.4)
RPR Ser Ql: NONREACTIVE
WBC: 10 10*3/uL (ref 3.4–10.8)

## 2019-01-09 ENCOUNTER — Encounter: Payer: Self-pay | Admitting: Advanced Practice Midwife

## 2019-01-09 ENCOUNTER — Other Ambulatory Visit: Payer: Self-pay

## 2019-01-09 ENCOUNTER — Ambulatory Visit (INDEPENDENT_AMBULATORY_CARE_PROVIDER_SITE_OTHER): Payer: BC Managed Care – PPO | Admitting: Advanced Practice Midwife

## 2019-01-09 VITALS — BP 120/68 | Wt 204.0 lb

## 2019-01-09 DIAGNOSIS — O34219 Maternal care for unspecified type scar from previous cesarean delivery: Secondary | ICD-10-CM

## 2019-01-09 DIAGNOSIS — Z23 Encounter for immunization: Secondary | ICD-10-CM | POA: Diagnosis not present

## 2019-01-09 DIAGNOSIS — Z3A31 31 weeks gestation of pregnancy: Secondary | ICD-10-CM

## 2019-01-09 DIAGNOSIS — Z3480 Encounter for supervision of other normal pregnancy, unspecified trimester: Secondary | ICD-10-CM

## 2019-01-09 LAB — POCT URINALYSIS DIPSTICK OB: Glucose, UA: NEGATIVE

## 2019-01-09 NOTE — Progress Notes (Signed)
  Routine Prenatal Care Visit  Subjective  Roberta Coleman is a 33 y.o. G3P1011 at [redacted]w[redacted]d being seen today for ongoing prenatal care.  She is currently monitored for the following issues for this high-risk pregnancy and has HYPERLIPIDEMIA; OBESITY; History of cesarean delivery, currently pregnant; Supervision of other normal pregnancy, antepartum; E. coli UTI; and Anxiety on their problem list.  ----------------------------------------------------------------------------------- Patient reports no complaints.  She is leaning more towards repeat c/section and still plans to wait until growth scan to make a decision. Contractions: Not present. Vag. Bleeding: None.  Movement: Present. Denies leaking of fluid.  ----------------------------------------------------------------------------------- The following portions of the patient's history were reviewed and updated as appropriate: allergies, current medications, past family history, past medical history, past social history, past surgical history and problem list. Problem list updated.   Objective  Blood pressure 120/68, weight 204 lb (92.5 kg), last menstrual period 06/06/2018 Pregravid weight 180 lb (81.6 kg) Total Weight Gain 24 lb (10.9 kg) Urinalysis: Urine Protein Trace  Urine Glucose Negative  Fetal Status: Fetal Heart Rate (bpm): 133 Fundal Height: 32 cm Movement: Present     General:  Alert, oriented and cooperative. Patient is in no acute distress.  Skin: Skin is warm and dry. No rash noted.   Cardiovascular: Normal heart rate noted  Respiratory: Normal respiratory effort, no problems with respiration noted  Abdomen: Soft, gravid, appropriate for gestational age. Pain/Pressure: Absent     Pelvic:  Cervical exam deferred        Extremities: Normal range of motion.     Mental Status: Normal mood and affect. Normal behavior. Normal judgment and thought content.   Assessment   33 y.o. G3P1011 at [redacted]w[redacted]d by  03/13/2019, by Last Menstrual  Period presenting for routine prenatal visit  Plan   Pregnancy#3 Problems (from 06/06/18 to present)    Problem Noted Resolved   E. coli UTI 07/31/2018 by Malachy Mood, MD No   Overview Signed 07/31/2018  8:23 AM by Malachy Mood, MD    On NOB      Supervision of other normal pregnancy, antepartum 07/30/2018 by Malachy Mood, MD No   Overview Addendum 12/30/2018  8:10 AM by Malachy Mood, MD    Clinic [facility] Prenatal Labs  Dating LMP=9wk Blood type: B pos  Genetic Screen NIPS: Normal XX Antibody: negative  Anatomic [place] Complete Rubella: Immune Varicella: Immune  GTT Early 122, 28 week: 90 RPR: NR  Rhogam N/A HBsAg: negative  TDaP vaccine                       Flu Shot: HIV: negative  Baby Food                                GBS:   Contraception  Pap: 04/04/2018 NIL HPV negative  Pelvis Tested     CS/VBAC ?   Support Person Roberta Coleman              Preterm labor symptoms and general obstetric precautions including but not limited to vaginal bleeding, contractions, leaking of fluid and fetal movement were reviewed in detail with the patient. Please refer to After Visit Summary for other counseling recommendations.   Return in about 2 weeks (around 01/23/2019) for telephone rob.  Rod Can, CNM 01/09/2019 1:56 PM

## 2019-01-09 NOTE — Progress Notes (Signed)
Tdap & Blood transfusion consent today. No complaints 

## 2019-01-09 NOTE — Patient Instructions (Signed)
Third Trimester of Pregnancy The third trimester is from week 28 through week 40 (months 7 through 9). The third trimester is a time when the unborn baby (fetus) is growing rapidly. At the end of the ninth month, the fetus is about 20 inches in length and weighs 6-10 pounds. Body changes during your third trimester Your body will continue to go through many changes during pregnancy. The changes vary from woman to woman. During the third trimester:  Your weight will continue to increase. You can expect to gain 25-35 pounds (11-16 kg) by the end of the pregnancy.  You may begin to get stretch marks on your hips, abdomen, and breasts.  You may urinate more often because the fetus is moving lower into your pelvis and pressing on your bladder.  You may develop or continue to have heartburn. This is caused by increased hormones that slow down muscles in the digestive tract.  You may develop or continue to have constipation because increased hormones slow digestion and cause the muscles that push waste through your intestines to relax.  You may develop hemorrhoids. These are swollen veins (varicose veins) in the rectum that can itch or be painful.  You may develop swollen, bulging veins (varicose veins) in your legs.  You may have increased body aches in the pelvis, back, or thighs. This is due to weight gain and increased hormones that are relaxing your joints.  You may have changes in your hair. These can include thickening of your hair, rapid growth, and changes in texture. Some women also have hair loss during or after pregnancy, or hair that feels dry or thin. Your hair will most likely return to normal after your baby is born.  Your breasts will continue to grow and they will continue to become tender. A yellow fluid (colostrum) may leak from your breasts. This is the first milk you are producing for your baby.  Your belly button may stick out.  You may notice more swelling in your hands,  face, or ankles.  You may have increased tingling or numbness in your hands, arms, and legs. The skin on your belly may also feel numb.  You may feel short of breath because of your expanding uterus.  You may have more problems sleeping. This can be caused by the size of your belly, increased need to urinate, and an increase in your body's metabolism.  You may notice the fetus "dropping," or moving lower in your abdomen (lightening).  You may have increased vaginal discharge.  You may notice your joints feel loose and you may have pain around your pelvic bone. What to expect at prenatal visits You will have prenatal exams every 2 weeks until week 36. Then you will have weekly prenatal exams. During a routine prenatal visit:  You will be weighed to make sure you and the baby are growing normally.  Your blood pressure will be taken.  Your abdomen will be measured to track your baby's growth.  The fetal heartbeat will be listened to.  Any test results from the previous visit will be discussed.  You may have a cervical check near your due date to see if your cervix has softened or thinned (effaced).  You will be tested for Group B streptococcus. This happens between 35 and 37 weeks. Your health care provider may ask you:  What your birth plan is.  How you are feeling.  If you are feeling the baby move.  If you have had any abnormal   symptoms, such as leaking fluid, bleeding, severe headaches, or abdominal cramping.  If you are using any tobacco products, including cigarettes, chewing tobacco, and electronic cigarettes.  If you have any questions. Other tests or screenings that may be performed during your third trimester include:  Blood tests that check for low iron levels (anemia).  Fetal testing to check the health, activity level, and growth of the fetus. Testing is done if you have certain medical conditions or if there are problems during the pregnancy.  Nonstress test  (NST). This test checks the health of your baby to make sure there are no signs of problems, such as the baby not getting enough oxygen. During this test, a belt is placed around your belly. The baby is made to move, and its heart rate is monitored during movement. What is false labor? False labor is a condition in which you feel small, irregular tightenings of the muscles in the womb (contractions) that usually go away with rest, changing position, or drinking water. These are called Braxton Hicks contractions. Contractions may last for hours, days, or even weeks before true labor sets in. If contractions come at regular intervals, become more frequent, increase in intensity, or become painful, you should see your health care provider. What are the signs of labor?  Abdominal cramps.  Regular contractions that start at 10 minutes apart and become stronger and more frequent with time.  Contractions that start on the top of the uterus and spread down to the lower abdomen and back.  Increased pelvic pressure and dull back pain.  A watery or bloody mucus discharge that comes from the vagina.  Leaking of amniotic fluid. This is also known as your "water breaking." It could be a slow trickle or a gush. Let your health care provider know if it has a color or strange odor. If you have any of these signs, call your health care provider right away, even if it is before your due date. Follow these instructions at home: Medicines  Follow your health care provider's instructions regarding medicine use. Specific medicines may be either safe or unsafe to take during pregnancy.  Take a prenatal vitamin that contains at least 600 micrograms (mcg) of folic acid.  If you develop constipation, try taking a stool softener if your health care provider approves. Eating and drinking   Eat a balanced diet that includes fresh fruits and vegetables, whole grains, good sources of protein such as meat, eggs, or tofu,  and low-fat dairy. Your health care provider will help you determine the amount of weight gain that is right for you.  Avoid raw meat and uncooked cheese. These carry germs that can cause birth defects in the baby.  If you have low calcium intake from food, talk to your health care provider about whether you should take a daily calcium supplement.  Eat four or five small meals rather than three large meals a day.  Limit foods that are high in fat and processed sugars, such as fried and sweet foods.  To prevent constipation: ? Drink enough fluid to keep your urine clear or pale yellow. ? Eat foods that are high in fiber, such as fresh fruits and vegetables, whole grains, and beans. Activity  Exercise only as directed by your health care provider. Most women can continue their usual exercise routine during pregnancy. Try to exercise for 30 minutes at least 5 days a week. Stop exercising if you experience uterine contractions.  Avoid heavy lifting.  Do   not exercise in extreme heat or humidity, or at high altitudes.  Wear low-heel, comfortable shoes.  Practice good posture.  You may continue to have sex unless your health care provider tells you otherwise. Relieving pain and discomfort  Take frequent breaks and rest with your legs elevated if you have leg cramps or low back pain.  Take warm sitz baths to soothe any pain or discomfort caused by hemorrhoids. Use hemorrhoid cream if your health care provider approves.  Wear a good support bra to prevent discomfort from breast tenderness.  If you develop varicose veins: ? Wear support pantyhose or compression stockings as told by your healthcare provider. ? Elevate your feet for 15 minutes, 3-4 times a day. Prenatal care  Write down your questions. Take them to your prenatal visits.  Keep all your prenatal visits as told by your health care provider. This is important. Safety  Wear your seat belt at all times when driving.  Make  a list of emergency phone numbers, including numbers for family, friends, the hospital, and police and fire departments. General instructions  Avoid cat litter boxes and soil used by cats. These carry germs that can cause birth defects in the baby. If you have a cat, ask someone to clean the litter box for you.  Do not travel far distances unless it is absolutely necessary and only with the approval of your health care provider.  Do not use hot tubs, steam rooms, or saunas.  Do not drink alcohol.  Do not use any products that contain nicotine or tobacco, such as cigarettes and e-cigarettes. If you need help quitting, ask your health care provider.  Do not use any medicinal herbs or unprescribed drugs. These chemicals affect the formation and growth of the baby.  Do not douche or use tampons or scented sanitary pads.  Do not cross your legs for long periods of time.  To prepare for the arrival of your baby: ? Take prenatal classes to understand, practice, and ask questions about labor and delivery. ? Make a trial run to the hospital. ? Visit the hospital and tour the maternity area. ? Arrange for maternity or paternity leave through employers. ? Arrange for family and friends to take care of pets while you are in the hospital. ? Purchase a rear-facing car seat and make sure you know how to install it in your car. ? Pack your hospital bag. ? Prepare the baby's nursery. Make sure to remove all pillows and stuffed animals from the baby's crib to prevent suffocation.  Visit your dentist if you have not gone during your pregnancy. Use a soft toothbrush to brush your teeth and be gentle when you floss. Contact a health care provider if:  You are unsure if you are in labor or if your water has broken.  You become dizzy.  You have mild pelvic cramps, pelvic pressure, or nagging pain in your abdominal area.  You have lower back pain.  You have persistent nausea, vomiting, or diarrhea.   You have an unusual or bad smelling vaginal discharge.  You have pain when you urinate. Get help right away if:  Your water breaks before 37 weeks.  You have regular contractions less than 5 minutes apart before 37 weeks.  You have a fever.  You are leaking fluid from your vagina.  You have spotting or bleeding from your vagina.  You have severe abdominal pain or cramping.  You have rapid weight loss or weight gain.  You have   shortness of breath with chest pain.  You notice sudden or extreme swelling of your face, hands, ankles, feet, or legs.  Your baby makes fewer than 10 movements in 2 hours.  You have severe headaches that do not go away when you take medicine.  You have vision changes. Summary  The third trimester is from week 28 through week 40, months 7 through 9. The third trimester is a time when the unborn baby (fetus) is growing rapidly.  During the third trimester, your discomfort may increase as you and your baby continue to gain weight. You may have abdominal, leg, and back pain, sleeping problems, and an increased need to urinate.  During the third trimester your breasts will keep growing and they will continue to become tender. A yellow fluid (colostrum) may leak from your breasts. This is the first milk you are producing for your baby.  False labor is a condition in which you feel small, irregular tightenings of the muscles in the womb (contractions) that eventually go away. These are called Braxton Hicks contractions. Contractions may last for hours, days, or even weeks before true labor sets in.  Signs of labor can include: abdominal cramps; regular contractions that start at 10 minutes apart and become stronger and more frequent with time; watery or bloody mucus discharge that comes from the vagina; increased pelvic pressure and dull back pain; and leaking of amniotic fluid. This information is not intended to replace advice given to you by your health  care provider. Make sure you discuss any questions you have with your health care provider. Document Released: 06/20/2001 Document Revised: 10/17/2018 Document Reviewed: 08/01/2016 Elsevier Patient Education  2020 [employer]  

## 2019-01-27 ENCOUNTER — Ambulatory Visit (INDEPENDENT_AMBULATORY_CARE_PROVIDER_SITE_OTHER): Payer: BC Managed Care – PPO | Admitting: Obstetrics and Gynecology

## 2019-01-27 ENCOUNTER — Other Ambulatory Visit: Payer: Self-pay

## 2019-01-27 DIAGNOSIS — Z3A33 33 weeks gestation of pregnancy: Secondary | ICD-10-CM

## 2019-01-27 DIAGNOSIS — O99213 Obesity complicating pregnancy, third trimester: Secondary | ICD-10-CM

## 2019-01-27 DIAGNOSIS — O34219 Maternal care for unspecified type scar from previous cesarean delivery: Secondary | ICD-10-CM

## 2019-01-27 DIAGNOSIS — Z3480 Encounter for supervision of other normal pregnancy, unspecified trimester: Secondary | ICD-10-CM

## 2019-01-27 NOTE — Progress Notes (Signed)
I connected with Roberta Coleman on 01/27/19 at  2:50 PM EDT by telephone and verified that I am speaking with the correct person using two identifiers.   I discussed the limitations, risks, security and privacy concerns of performing an evaluation and management service by telephone and the availability of in person appointments. I also discussed with the patient that there may be a patient responsible charge related to this service. The patient expressed understanding and agreed to proceed.  The patient was at home I spoke with the patient from my workstation phone. The names of people involved in this encounter were: Adeline L Weekley , \and Vena AustriaAndreas Jaron Czarnecki  Routine Prenatal Care Visit  Subjective  Roberta Coleman is a 33 y.o. G3P1011 at 7449w4d being seen today for ongoing prenatal care.  She is currently monitored for the following issues for this low-risk pregnancy and has HYPERLIPIDEMIA; OBESITY; History of cesarean delivery, currently pregnant; Supervision of other normal pregnancy, antepartum; E. coli UTI; and Anxiety on their problem list.  ----------------------------------------------------------------------------------- Patient reports no complaints.    .  .   . Denies leaking of fluid.  ----------------------------------------------------------------------------------- The following portions of the patient's history were reviewed and updated as appropriate: allergies, current medications, past family history, past medical history, past social history, past surgical history and problem list. Problem list updated.   Objective  Last menstrual period 06/06/2018, unknown if currently breastfeeding. Pregravid weight 180 lb (81.6 kg) Total Weight Gain 24 lb (10.9 kg) Urinalysis:      Fetal Status:           No physical exam as this was a remote telephone visit to promote social distancing during the current COVID-19 Pandemic  Assessment   33 y.o. G3P1011 at 149w4d by   03/13/2019, by Last Menstrual Period presenting for routine prenatal visit  Plan   Pregnancy#3 Problems (from 06/06/18 to present)    Problem Noted Resolved   E. coli UTI 07/31/2018 by Vena AustriaStaebler, Sula Fetterly, MD No   Overview Signed 07/31/2018  8:23 AM by Vena AustriaStaebler, Azzan Butler, MD    On NOB      Supervision of other normal pregnancy, antepartum 07/30/2018 by Vena AustriaStaebler, Ryann Pauli, MD No   Overview Addendum 12/30/2018  8:10 AM by Vena AustriaStaebler, Sidney Kann, MD    Clinic [facility] Prenatal Labs  Dating LMP=9wk Blood type: B pos  Genetic Screen NIPS: Normal XX Antibody: negative  Anatomic US Complete Rubella: Immune Varicella: Immune  GTT Early 122, 28 week: 90 RPR: NR  Rhogam N/A HBsAg: negative  TDaP vaccine                       Flu Shot: HIV: negative  Baby Food                                GBS:   Contraception  Pap: 04/04/2018 NIL HPV negative  Pelvis Tested     CS/VBAC ?   Support Person Thereasa Distancellen [place]              Gestational age appropriate obstetric precautions including but not limited to vaginal bleeding, contractions, leaking of fluid and fetal movement were reviewed in detail with the patient.    33 y.o. G3P1011 at 7349w4d with Estimated Date of Delivery: 03/13/19 was seen today in office to discuss trial of labor after cesarean section (TOLAC) versus elective repeat cesarean delivery (ERCD). The following risks were discussed with the  patient.  Risk of uterine rupture at term is 0.78 percent with TOLAC and 0.22 percent with ERCD. 1 in 10 uterine ruptures will result in neonatal death or neurological injury. The benefits of a trial of labor after cesarean (TOLAC) resulting in a vaginal birth after cesarean (VBAC) include the following: shorter length of hospital stay and postpartum recovery (in most cases); fewer complications, such as postpartum fever, wound or uterine infection, thromboembolism (blood clots in the leg or lung), need for blood transfusion and fewer neonatal breathing  problems. The risks of an attempted VBAC or TOLAC include the following: Risk of failed trial of labor after cesarean (TOLAC) without a vaginal birth after cesarean (VBAC) resulting in repeat cesarean delivery (RCD) in about 20 to 40 percent of women who attempt VBAC.  Her individualized success rate using the MFMU VBAC risk calculator is 45%.   Risk of rupture of uterus resulting in an emergency cesarean delivery. The risk of uterine rupture may be related in part to the type of uterine incision made during the first cesarean delivery. A previous transverse uterine incision has the lowest risk of rupture (0.2 to 1.5 percent risk). Vertical or T-shaped uterine incisions have a higher risk of uterine rupture (4 to 9 percent risk)The risk of fetal death is very low with both VBAC and elective repeat cesarean delivery (ERCD), but the likelihood of fetal death is higher with VBAC than with ERCD. Maternal death is very rare with either type of delivery. The risks of an elective repeat cesarean delivery (ERCD) were reviewed with the patient including but not limited to: 08/998 risk of uterine rupture which could have serious consequences, bleeding which may require transfusion; infection which may require antibiotics; injury to bowel, bladder or other surrounding organs (bowel, bladder, ureters); injury to the fetus; need for additional procedures including hysterectomy in the event of a life-threatening hemorrhage; thromboembolic phenomenon; abnormal placentation; incisional problems; death and other postoperative or anesthesia complications.    In addition we discussed that our collective office practice is to allow patient's who desire to attempt TOLAC to go into labor naturally.  There is some limited data that rupture rate may increase past [redacted] weeks gestation, but it is reasonable for women who are strongly committed [facility] to continue pregnancy into the 41st week.  Medical indications necessetating early  delivery may arise during the course of any pregnancy.  Given the contraindication on the use of prostaglandins for use in cervical ripening,  recommendation would be to proceed with repeat cesarean for delivery for patient's with unfavorable cervix (low Bishops score) who reach 41 weeks or who otherwise have a medical indication for early delivery.   These risks and benefits are summarized on the consent form, which was reviewed with the patient during the visit.  All her questions answered and she signed a consent indicating a preference for TOLAC/ERCD. A copy of the consent was given to the patient.  MFMU VBAC calculator success rate of 45 The patient was counseled regarding recommendation for elective repeat cesarean section (ERCS) given that she has failed to go into spontaneous labor by 6746w3d gestation and has an unfavorable cervix.  Previous studies examining have noted an increased risk or maternal and fetal morbidity for patient attempting a trial of labor whose success rated is <70% compared to [facility], while morbidity was similar for patient attempting a trial of labor vs ERCS with success rates >70. ("Can a prediction model for vaginal birth after cesarean section also predict the  probablity of  Morbidity related to a trial of labor?" American Jourcal of Obstetric and Gynecology 2009  January)  Repeat C-section 8/27 repeat schedule   Return in about 2 weeks (around 02/10/2019) for ROB.  Malachy Mood, MD, [facility], [facility] 01/27/2019, 7:28 PM

## 2019-01-27 NOTE — Progress Notes (Signed)
ROB televisit No concerns  

## 2019-01-29 ENCOUNTER — Telehealth: Payer: Self-pay | Admitting: Obstetrics and Gynecology

## 2019-01-29 NOTE — Telephone Encounter (Signed)
Patient is aware of H&P on 03/03/19 @ 10:50am w/ Dr. Georgianne Fick, Pre-admit testing and covid testing to be scheduled, and OR on 03/06/19.

## 2019-01-29 NOTE — Telephone Encounter (Signed)
-----   Message from Malachy Mood, MD sent at 01/27/2019  7:24 PM EDT ----- Regarding: C-section Surgery Date: 03/06/2019  LOS: surgery admit  Surgery Booking Request Patient Full Name: Roberta Coleman MRN: [id number]  DOB: [birth year]  Surgeon: Malachy Mood, MD  Requested Surgery Date and Time: 0745 Primary Diagnosis and Code: G54.982 history of prior cesarean section Secondary Diagnosis and Code:  Surgical Procedure: Cesarean Section L&D Notification: Yes Admission Status: surgery admit Length of Surgery: 1hr Special Case Needs: none H&P: week of (date) Phone Interview or Office Pre-Admit: pre-admit Interpreter: No Language: English Medical Clearance: No Special Scheduling Instructions: none

## 2019-02-11 ENCOUNTER — Ambulatory Visit (INDEPENDENT_AMBULATORY_CARE_PROVIDER_SITE_OTHER): Payer: BC Managed Care – PPO | Admitting: Obstetrics and Gynecology

## 2019-02-11 ENCOUNTER — Other Ambulatory Visit: Payer: Self-pay

## 2019-02-11 VITALS — BP 104/66 | Wt 207.0 lb

## 2019-02-11 DIAGNOSIS — O34219 Maternal care for unspecified type scar from previous cesarean delivery: Secondary | ICD-10-CM

## 2019-02-11 DIAGNOSIS — Z3685 Encounter for antenatal screening for Streptococcus B: Secondary | ICD-10-CM

## 2019-02-11 DIAGNOSIS — Z3480 Encounter for supervision of other normal pregnancy, unspecified trimester: Secondary | ICD-10-CM

## 2019-02-11 DIAGNOSIS — Z3A35 35 weeks gestation of pregnancy: Secondary | ICD-10-CM

## 2019-02-11 LAB — POCT URINALYSIS DIPSTICK OB
Glucose, UA: NEGATIVE
POC,PROTEIN,UA: NEGATIVE

## 2019-02-11 NOTE — Progress Notes (Signed)
ROB

## 2019-02-12 NOTE — Progress Notes (Signed)
    Routine Prenatal Care Visit  Subjective  Roberta Coleman is a 33 y.o. G3P1011 at [redacted]w[redacted]d being seen today for ongoing prenatal care.  She is currently monitored for the following issues for this low-risk pregnancy and has HYPERLIPIDEMIA; OBESITY; History of cesarean delivery, currently pregnant; Supervision of other normal pregnancy, antepartum; E. coli UTI; and Anxiety on their problem list.  ----------------------------------------------------------------------------------- Patient reports no complaints.   Contractions: Not present. Vag. Bleeding: None.  Movement: Present. Denies leaking of fluid.  ----------------------------------------------------------------------------------- The following portions of the patient's history were reviewed and updated as appropriate: allergies, current medications, past family history, past medical history, past social history, past surgical history and problem list. Problem list updated.   Objective  Blood pressure 104/66, weight 207 lb (93.9 kg), last menstrual period 06/06/2018, unknown if currently breastfeeding. Pregravid weight 180 lb (81.6 kg) Total Weight Gain 27 lb (12.2 kg) Urinalysis:      Fetal Status: Fetal Heart Rate (bpm): 135 Fundal Height: 37 cm Movement: Present     General:  Alert, oriented and cooperative. Patient is in no acute distress.  Skin: Skin is warm and dry. No rash noted.   Cardiovascular: Normal heart rate noted  Respiratory: Normal respiratory effort, no problems with respiration noted  Abdomen: Soft, gravid, appropriate for gestational age. Pain/Pressure: Absent     Pelvic:  Cervical exam performed        Extremities: Normal range of motion.     ental Status: Normal mood and affect. Normal behavior. Normal judgment and thought content.     Assessment   33 y.o. G3P1011 at [redacted]w[redacted]d by  03/13/2019, by Last Menstrual Period presenting for routine prenatal visit  Plan   Pregnancy#3 Problems (from 06/06/18 to  present)    Problem Noted Resolved   E. coli UTI 07/31/2018 by Malachy Mood, MD No   Overview Signed 07/31/2018  8:23 AM by Malachy Mood, MD    On NOB      Supervision of other normal pregnancy, antepartum 07/30/2018 by Malachy Mood, MD No   Overview Addendum 12/30/2018  8:10 AM by Malachy Mood, MD    Clinic [facility] Prenatal Labs  Dating LMP=9wk Blood type: B pos  Genetic Screen NIPS: Normal XX Antibody: negative  Anatomic [place] Complete Rubella: Immune Varicella: Immune  GTT Early 122, 28 week: 90 RPR: NR  Rhogam N/A HBsAg: negative  TDaP vaccine 01/09/2019 Flu Shot: HIV: negative  Baby Food                                GBS:   Contraception  Pap: 04/04/2018 NIL HPV negative  Pelvis Tested     CS/VBAC C-section 8/27   Support Person Roberta Coleman             Gestational age appropriate obstetric precautions including but not limited to vaginal bleeding, contractions, leaking of fluid and fetal movement were reviewed in detail with the patient.    - ROB and growth scan next visit - GBS today  Return in about 1 week (around 02/18/2019) for [place].  Malachy Mood, MD, [facility], [place]

## 2019-02-13 LAB — STREP GP B NAA: Strep Gp B NAA: POSITIVE — AB

## 2019-02-18 ENCOUNTER — Ambulatory Visit: Payer: BC Managed Care – PPO

## 2019-02-18 ENCOUNTER — Encounter: Payer: BC Managed Care – PPO | Admitting: Obstetrics and Gynecology

## 2019-02-24 ENCOUNTER — Other Ambulatory Visit: Payer: Self-pay

## 2019-02-24 ENCOUNTER — Ambulatory Visit (INDEPENDENT_AMBULATORY_CARE_PROVIDER_SITE_OTHER): Payer: BC Managed Care – PPO

## 2019-02-24 ENCOUNTER — Ambulatory Visit (INDEPENDENT_AMBULATORY_CARE_PROVIDER_SITE_OTHER): Payer: BC Managed Care – PPO | Admitting: Obstetrics and Gynecology

## 2019-02-24 VITALS — BP 108/60 | Wt 211.0 lb

## 2019-02-24 DIAGNOSIS — Z362 Encounter for other antenatal screening follow-up: Secondary | ICD-10-CM

## 2019-02-24 DIAGNOSIS — Z3A37 37 weeks gestation of pregnancy: Secondary | ICD-10-CM

## 2019-02-24 DIAGNOSIS — Z3480 Encounter for supervision of other normal pregnancy, unspecified trimester: Secondary | ICD-10-CM

## 2019-02-24 DIAGNOSIS — O34219 Maternal care for unspecified type scar from previous cesarean delivery: Secondary | ICD-10-CM

## 2019-02-24 NOTE — Progress Notes (Signed)
ROB  Growth scan 

## 2019-02-24 NOTE — Progress Notes (Signed)
Routine Prenatal Care Visit  Subjective  Roberta Coleman is a 33 y.o. G3P1011 at 6474w4d being seen today for ongoing prenatal care.  She is currently monitored for the following issues for this high-risk pregnancy and has HYPERLIPIDEMIA; OBESITY; History of cesarean delivery, currently pregnant; Supervision of other normal pregnancy, antepartum; E. coli UTI; and Anxiety on their problem list.  ----------------------------------------------------------------------------------- Patient reports no complaints.   Contractions: Not present. Vag. Bleeding: None.  Movement: Present. Denies leaking of fluid.  ----------------------------------------------------------------------------------- The following portions of the patient's history were reviewed and updated as appropriate: allergies, current medications, past family history, past medical history, past social history, past surgical history and problem list. Problem list updated.   Objective  Blood pressure 108/60, weight 211 lb (95.7 kg), last menstrual period 06/06/2018, unknown if currently breastfeeding. Pregravid weight 180 lb (81.6 kg) Total Weight Gain 31 lb (14.1 kg) Urinalysis:      Fetal Status: Fetal Heart Rate (bpm): 140 Fundal Height: 37 cm Movement: Present  Presentation: Vertex  General:  Alert, oriented and cooperative. Patient is in no acute distress.  Skin: Skin is warm and dry. No rash noted.   Cardiovascular: Normal heart rate noted  Respiratory: Normal respiratory effort, no problems with respiration noted  Abdomen: Soft, gravid, appropriate for gestational age. Pain/Pressure: Absent     Pelvic:  Cervical exam deferred        Extremities: Normal range of motion.     ental Status: Normal mood and affect. Normal behavior. Normal judgment and thought content.   [place] [facility] Follow Up  Result Date: 02/24/2019 Patient Name: Roberta Coleman DOB: July 22, 1985 MRN: [id number] ULTRASOUND REPORT Location: [facility] Date of  Service: 02/24/2019 Indications:growth/afi Findings: Mason JimSingleton intrauterine pregnancy is visualized with FHR at 144 BPM. Biometrics give an (U/S) Gestational age of 7168w0d and an (U/S) EDD of 03/17/2019; this correlates with the clinically established Estimated Date of Delivery: 03/13/19. Fetal presentation is Cephalic. Placenta: left lateral. Grade: 1 AFI: 11.2 cm Growth percentile is 68.4%. EFW: 3393 g  ( 7 lb 8 oz ) Impression: 1. 2174w4d Viable Singleton Intrauterine pregnancy previously established criteria. 2. Growth is 68.4 %ile.  AFI is 11.2 cm. Recommendations: 1.Clinical correlation with the patient's History and Physical Exam. Deanna ArtisElyse S Fairbanks, RT There is a singleton gestation with normal amniotic fluid volume. The fetal biometry correlates with established dating.  Limited fetal anatomy was performed.The visualized fetal anatomical survey appears within normal limits within the resolution of ultrasound as described above.  It must be noted that a normal ultrasound is unable to rule out fetal aneuploidy.  Vena AustriaAndreas Seona Clemenson, MD, Evern CoreFACOG [facility], [facility] 02/24/2019, 9:42 PM     Assessment   33 y.o. G3P1011 at 274w4d by  03/13/2019, by Last Menstrual Period presenting for routine prenatal visit  Plan   Pregnancy#3 Problems (from 06/06/18 to present)    Problem Noted Resolved   E. coli UTI 07/31/2018 by Vena AustriaStaebler, Aliece Honold, MD No   Overview Signed 07/31/2018  8:23 AM by Vena AustriaStaebler, Rhythm Wigfall, MD    On NOB      Supervision of other normal pregnancy, antepartum 07/30/2018 by Vena AustriaStaebler, Philopater Mucha, MD No   Overview Addendum 02/12/2019  6:02 PM by Vena AustriaStaebler, Joseh Sjogren, MD    Clinic [facility] Prenatal Labs  Dating LMP=9wk Blood type: B pos  Genetic Screen NIPS: Normal XX Antibody: negative  Anatomic US Complete Rubella: Immune Varicella: Immune  GTT Early 122, 28 week: 90 RPR: NR  Rhogam N/A HBsAg: negative  TDaP vaccine 01/09/2019  Flu Shot: HIV: negative  Baby Food                                 GBS:   Contraception  Pap: 04/04/2018 NIL HPV negative  Pelvis Tested     CS/VBAC C-section 8/27   Support Person Lajuanda Penick              Gestational age appropriate obstetric precautions including but not limited to vaginal bleeding, contractions, leaking of fluid and fetal movement were reviewed in detail with the patient.    - preop orders placed - growth scan reviewed  Return in about 1 week (around 03/03/2019) for ROB/PREOP.  Malachy Mood, MD, [facility], [facility] 02/24/2019, 3:21 PM

## 2019-02-27 ENCOUNTER — Inpatient Hospital Stay: Admission: RE | Admit: 2019-02-27 | Payer: BLUE CROSS/BLUE SHIELD | Source: Ambulatory Visit

## 2019-03-02 ENCOUNTER — Observation Stay
Admission: EM | Admit: 2019-03-02 | Discharge: 2019-03-02 | Disposition: A | Discharge status: Home / Self Care | Payer: BC Managed Care – PPO | Attending: Obstetrics and Gynecology | Admitting: Obstetrics and Gynecology

## 2019-03-02 DIAGNOSIS — O471 False labor at or after 37 completed weeks of gestation: Secondary | ICD-10-CM | POA: Diagnosis not present

## 2019-03-02 DIAGNOSIS — N390 Urinary tract infection, site not specified: Secondary | ICD-10-CM

## 2019-03-02 DIAGNOSIS — Z8349 Family history of other endocrine, nutritional and metabolic diseases: Secondary | ICD-10-CM | POA: Diagnosis not present

## 2019-03-02 DIAGNOSIS — Z3A38 38 weeks gestation of pregnancy: Secondary | ICD-10-CM | POA: Diagnosis not present

## 2019-03-02 DIAGNOSIS — Z79899 Other long term (current) drug therapy: Secondary | ICD-10-CM | POA: Diagnosis not present

## 2019-03-02 DIAGNOSIS — O99213 Obesity complicating pregnancy, third trimester: Secondary | ICD-10-CM | POA: Insufficient documentation

## 2019-03-02 DIAGNOSIS — Z3A28 28 weeks gestation of pregnancy: Secondary | ICD-10-CM | POA: Insufficient documentation

## 2019-03-02 DIAGNOSIS — E785 Hyperlipidemia, unspecified: Secondary | ICD-10-CM | POA: Diagnosis not present

## 2019-03-02 DIAGNOSIS — E669 Obesity, unspecified: Secondary | ICD-10-CM | POA: Insufficient documentation

## 2019-03-02 DIAGNOSIS — B9620 Unspecified Escherichia coli [E. coli] as the cause of diseases classified elsewhere: Secondary | ICD-10-CM

## 2019-03-02 DIAGNOSIS — O99283 Endocrine, nutritional and metabolic diseases complicating pregnancy, third trimester: Secondary | ICD-10-CM | POA: Diagnosis not present

## 2019-03-02 DIAGNOSIS — Z885 Allergy status to narcotic agent status: Secondary | ICD-10-CM | POA: Diagnosis not present

## 2019-03-02 DIAGNOSIS — Z3480 Encounter for supervision of other normal pregnancy, unspecified trimester: Secondary | ICD-10-CM

## 2019-03-02 NOTE — OB Triage Note (Signed)
Pt reports ctxs starting at 0200.  No LOF or bleeding.  Normal fetal movement.

## 2019-03-02 NOTE — OB Triage Note (Signed)
Discharge home ambulatory with spouse. Dineen Kid

## 2019-03-02 NOTE — Discharge Summary (Addendum)
Physician Final Progress Note  Patient ID: Roberta Coleman MRN: [id number] DOB/AGE: 1986-01-31 33 y.o.  Admit date: 03/02/2019 Admitting provider: Conard NovakStephen D Jackson, MD Discharge date: 03/02/2019   Admission Diagnoses: contractions  Discharge Diagnoses:  Active Problems:   Indication for care in labor and delivery, antepartum IUP at 38 weeks Reactive NST Not in labor  History of Present Illness: The patient is a 33 y.o. female G3P1011 at 1036w3d who presents for regular contractions that started at 2:00 this morning. She reports good fetal movement. She denies leakage of fluid or vaginal bleeding.  She is scheduled for a c/section on Thursday of this week.   Past Medical History:  Diagnosis Date  . Allergy   . High cholesterol   . History of blood transfusion 2017   symptomatic anemia after incomplete AB/ D&C  . Hyperlipidemia   . Obesity     Past Surgical History:  Procedure Laterality Date  . CESAREAN SECTION    . DILATION AND EVACUATION N/A 01/21/2016   Procedure: Suction D & C;  Surgeon: Vena AustriaAndreas Staebler, MD;  Location: [facility] ORS;  Service: Gynecology;  Laterality: N/A;  . EYE SURGERY  2010   LASIX  . WISDOM TOOTH EXTRACTION      No current facility-administered medications on file prior to encounter.    Current Outpatient Medications on File Prior to Encounter  Medication Sig Dispense Refill  . acetaminophen (TYLENOL) 325 MG tablet Take 650 mg by mouth every 6 (six) hours as needed for moderate pain.    . prenatal vitamin w/FE, FA (PRENATAL 1 + 1) 27-1 MG TABS tablet Take 1 tablet by mouth daily at 12 noon.    . sertraline (ZOLOFT) 50 MG tablet TAKE 1 TABLET BY MOUTH EVERY DAY (Patient taking differently: Take 50 mg by mouth at bedtime. ) 90 tablet 3    Allergies  Allergen Reactions  . Oxycodone Hcl Itching    Social History   Socioeconomic History  . Marital status: Married    Spouse name: Not on file  . Number of children: Not on file  . Years of  education: Not on file  . Highest education level: Not on file  Occupational History  . 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 Financial resource strain: Not on file  . Food insecurity    Worry: Not on file    Inability: Not on file  . Transportation needs    Medical: Not on file    Non-medical: Not on file  Tobacco Use  . Smoking status: Never Smoker  . Smokeless tobacco: Never Used  Substance and Sexual Activity  . Alcohol use: Yes    Comment: occasionally  . Drug use: Never  . Sexual activity: Yes    Partners: Male    Birth control/protection: None  Lifestyle  . Physical activity    Days per week: Not on file    Minutes per session: Not on file  . Stress: Not on file  Relationships  . Social Musicianconnections    Talks on phone: Not on file    Gets together: Not on file    Attends religious service: Not on file    Active member of club or organization: Not on file    Attends meetings of clubs or organizations: Not on file    Relationship status: Not on file  . Intimate partner violence    Fear of current or ex partner: Not on file  Emotionally abused: Not on file    Physically abused: Not on file    Forced sexual activity: Not on file  Other Topics Concern  . Not on file  Social History Narrative   Interval running-walks the dog          Family History  Problem Relation Age of Onset  . Hypertension Mother   . Hyperlipidemia Father      Review of Systems  Constitutional: Negative.   HENT: Negative.   Eyes: Negative.   Respiratory: Negative.   Cardiovascular: Negative.   Gastrointestinal: Negative.   Genitourinary: Negative.   Musculoskeletal: Negative.   Skin: Negative.   Neurological: Negative.   Endo/Heme/Allergies: Negative.   Psychiatric/Behavioral: Negative.      Physical Exam: BP 125/73   Pulse 99   Temp 98.5 F (36.9 C) (Oral)   Resp 16   LMP 06/06/2018 (Exact Date)   Constitutional: Well nourished,  well developed female in no acute distress.  HEENT: normal Skin: Warm and dry.  Cardiovascular: Regular rate and rhythm.   Extremity: trace edema  Respiratory: Clear to auscultation bilateral. Normal respiratory effort Abdomen: FHT present Back: no CVAT Neuro: DTRs 2+, Cranial nerves grossly intact Psych: Alert and Oriented x3. No memory deficits. Normal mood and affect.  MS: normal gait, normal bilateral lower extremity ROM/strength/stability.  Pelvic exam: Per RN exam  3/60/-3/posterior on admission to triage and unchanged prior to discharge Toco: Every 3-8 minutes Fetal well being: 130 bpm, moderate variability, +accelerations, -decelerations   Consults: None  Significant Findings/ Diagnostic Studies: none  Procedures: NST  Hospital Course: The patient was admitted to Labor and Delivery Triage for observation.   Discharge Condition: good  Disposition: Discharge disposition: 01-Home or Self Care     Diet: Regular diet  Discharge Activity: Activity as tolerated  Discharge Instructions    Discharge activity:  No Restrictions   Complete by: As directed    Discharge diet:  No restrictions   Complete by: As directed    Fetal Kick Count:  Lie on our left side for one hour after a meal, and count the number of times your baby kicks.  If it is less than 5 times, get up, move around and drink some juice.  Repeat the test 30 minutes later.  If it is still less than 5 kicks in an hour, notify your doctor.   Complete by: As directed    LABOR:  When conractions begin, you should start to time them from the beginning of one contraction to the beginning  of the next.  When contractions are 5 - 10 minutes apart or less and have been regular for at least an hour, you should call your health care provider.   Complete by: As directed    No sexual activity restrictions   Complete by: As directed    Notify physician for bleeding from the vagina   Complete by: As directed    Notify  physician for blurring of vision or spots before the eyes   Complete by: As directed    Notify physician for chills or fever   Complete by: As directed    Notify physician for fainting spells, "black outs" or loss of consciousness   Complete by: As directed    Notify physician for increase in vaginal discharge   Complete by: As directed    Notify physician for leaking of fluid   Complete by: As directed    Notify physician for pain or burning when urinating  Complete by: As directed    Notify physician for pelvic pressure (sudden increase)   Complete by: As directed    Notify physician for severe or continued nausea or vomiting   Complete by: As directed    Notify physician for sudden gushing of fluid from the vagina (with or without continued leaking)   Complete by: As directed    Notify physician for sudden, constant, or occasional abdominal pain   Complete by: As directed    Notify physician if baby moving less than usual   Complete by: As directed      Allergies as of 03/02/2019      Reactions   Oxycodone Hcl Itching      Medication List    TAKE these medications   acetaminophen 325 MG tablet Commonly known as: TYLENOL Take 650 mg by mouth every 6 (six) hours as needed for moderate pain.   prenatal vitamin w/FE, FA 27-1 MG Tabs tablet Take 1 tablet by mouth daily at 12 noon.   sertraline 50 MG tablet Commonly known as: ZOLOFT TAKE 1 TABLET BY MOUTH EVERY DAY What changed: when to take this        Total time spent taking care of this patient: 15 minutes  Signed: Rod Can, CNM  03/02/2019, 8:28 AM

## 2019-03-03 ENCOUNTER — Ambulatory Visit (INDEPENDENT_AMBULATORY_CARE_PROVIDER_SITE_OTHER): Payer: BC Managed Care – PPO | Admitting: Obstetrics and Gynecology

## 2019-03-03 ENCOUNTER — Other Ambulatory Visit: Payer: BC Managed Care – PPO

## 2019-03-03 ENCOUNTER — Other Ambulatory Visit: Admission: RE | Admit: 2019-03-03 | Payer: BC Managed Care – PPO | Source: Ambulatory Visit

## 2019-03-03 ENCOUNTER — Other Ambulatory Visit: Payer: Self-pay

## 2019-03-03 ENCOUNTER — Other Ambulatory Visit
Admission: RE | Admit: 2019-03-03 | Discharge: 2019-03-03 | Disposition: A | Discharge status: Home / Self Care | Payer: BC Managed Care – PPO | Source: Ambulatory Visit | Attending: Obstetrics and Gynecology | Admitting: Obstetrics and Gynecology

## 2019-03-03 VITALS — BP 112/74 | HR 109 | Ht 64.0 in | Wt 209.0 lb

## 2019-03-03 DIAGNOSIS — O34219 Maternal care for unspecified type scar from previous cesarean delivery: Secondary | ICD-10-CM

## 2019-03-03 DIAGNOSIS — Z3480 Encounter for supervision of other normal pregnancy, unspecified trimester: Secondary | ICD-10-CM

## 2019-03-03 DIAGNOSIS — Z3A38 38 weeks gestation of pregnancy: Secondary | ICD-10-CM

## 2019-03-03 NOTE — Progress Notes (Signed)
Obstetric H&P   Chief Complaint: ROB schedule C-section  Prenatal Care Provider: [facility]  History of Present Illness: 33 y.o. G3P1011 4077w4d by 03/13/2019, by Last Menstrual Period presenting to schedule cesarean section.  +FM, no LOF, no VB, irregular contractions.  [facility].  Pregnancy uncomplicated to date.    Pregravid weight 180 lb (81.6 kg) Total Weight Gain 31 lb (14.1 kg)  Pregnancy#3 Problems (from 06/06/18 to present)    Problem Noted Resolved   E. coli UTI 07/31/2018 by Vena AustriaStaebler, Steffan Caniglia, MD No   Overview Signed 07/31/2018  8:23 AM by Vena AustriaStaebler, Jebidiah Baggerly, MD    On NOB      Supervision of other normal pregnancy, antepartum 07/30/2018 by Vena AustriaStaebler, Donnae Michels, MD No   Overview Addendum 02/12/2019  6:02 PM by Vena AustriaStaebler, Javonda Suh, MD    Clinic [facility] Prenatal Labs  Dating LMP=9wk Blood type: B pos  Genetic Screen NIPS: Normal XX Antibody: negative  Anatomic US Complete Rubella: Immune Varicella: Immune  GTT Early 122, 28 week: 90 RPR: NR  Rhogam N/A HBsAg: negative  TDaP vaccine 01/09/2019  Flu Shot: HIV: negative  Baby Food                                GBS: positive  Contraception  Pap: 04/04/2018 NIL HPV negative  Pelvis Tested     CS/VBAC C-section 8/27   Support Person Thereasa Distancellen Kryder              Review of Systems: 10 point review of systems negative unless otherwise noted in HPI  Past Medical History: Past Medical History:  Diagnosis Date  . Allergy   . High cholesterol   . History of blood transfusion 2017   symptomatic anemia after incomplete AB/ D&C  . Hyperlipidemia   . Obesity     Past Surgical History: Past Surgical History:  Procedure Laterality Date  . CESAREAN SECTION    . DILATION AND EVACUATION N/A 01/21/2016   Procedure: Suction D & C;  Surgeon: Vena AustriaAndreas Adolf Ormiston, MD;  Location: [facility] ORS;  Service: Gynecology;  Laterality: N/A;  . EYE SURGERY  2010   LASIX  . WISDOM TOOTH EXTRACTION      Past  Obstetric History: # 1 - Date: None, Sex: None, Weight: None, GA: None, Delivery: None, Apgar1: None, Apgar5: None, Living: None, Birth Comments: None  # 2 - Date: 05/13/14, Sex: Female, Weight: 8 lb 6 oz (3.799 kg), GA: 556w0d, Delivery: C-Section, Low Transverse, Apgar1: None, Apgar5: None, Living: Living, Birth Comments: None  # 3 - Date: None, Sex: None, Weight: None, GA: None, Delivery: None, Apgar1: None, Apgar5: None, Living: None, Birth Comments: None   Past Gynecologic History:  Family History: Family History  Problem Relation Age of Onset  . Hypertension Mother   . Hyperlipidemia Father     Social History: Social History   Socioeconomic History  . Marital status: Married    Spouse name: Not on file  . Number of children: Not on file  . Years of education: Not on file  . Highest education level: Not on file  Occupational History  . 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 Financial resource strain: Not on file  . Food insecurity    Worry: Not on file    Inability: Not on file  . Transportation needs    Medical: Not on  file    Non-medical: Not on file  Tobacco Use  . Smoking status: Never Smoker  . Smokeless tobacco: Never Used  Substance and Sexual Activity  . Alcohol use: Not Currently    Comment: occasionally  . Drug use: Never  . Sexual activity: Yes    Partners: Male    Birth control/protection: None  Lifestyle  . Physical activity    Days per week: Not on file    Minutes per session: Not on file  . Stress: Not on file  Relationships  . Social Musicianconnections    Talks on phone: Not on file    Gets together: Not on file    Attends religious service: Not on file    Active member of club or organization: Not on file    Attends meetings of clubs or organizations: Not on file    Relationship status: Not on file  . Intimate partner violence    Fear of current or ex partner: Not on file    Emotionally abused: Not on file     Physically abused: Not on file    Forced sexual activity: Not on file  Other Topics Concern  . Not on file  Social History Narrative   Interval running-walks the dog          Medications: Prior to Admission medications   Medication Sig Start Date End Date Taking? Authorizing Provider  acetaminophen (TYLENOL) 325 MG tablet Take 650 mg by mouth every 6 (six) hours as needed for moderate pain.   Yes [provider]  prenatal vitamin w/FE, FA (PRENATAL 1 + 1) 27-1 MG TABS tablet Take 1 tablet by mouth daily at 12 noon.   Yes [provider]  sertraline (ZOLOFT) 50 MG tablet TAKE 1 TABLET BY MOUTH EVERY DAY Patient taking differently: Take 50 mg by mouth at bedtime.  08/26/18  Yes Oswaldo ConroySchmid, Jacelyn Y, CNM    Allergies: Allergies  Allergen Reactions  . Oxycodone Hcl Itching    Physical Exam: Vitals: Blood pressure 112/74, pulse (!) 109, height 5\' 4"  (1.626 m), weight 209 lb (94.8 kg), last menstrual period 06/06/2018, unknown if currently breastfeeding.   FHT: 140  General: NAD HEENT: normocephalic, anicteric Pulmonary: No increased work of breathing Cardiovascular: RRR, distal pulses 2+ Abdomen: Gravid, non-tender Leopolds: vtx Genitourinary: deferred Extremities: no edema, erythema, or tenderness Neurologic: Grossly intact Psychiatric: mood appropriate, affect full  Labs: No results found for this or any previous visit (from the past 24 hour(s)).  Assessment: 33 y.o. G3P1011 6331w4d by 03/13/2019, by Last Menstrual Period presenting for routine prenatal visit and to schedule C-section  Plan: 1) The patient was counseled regarding risk and benefits to proceeding with Cesarean section to expedite delivery.  Risk of cesarean section were discussed including risk of bleeding and need for potential intraoperative or postoperative blood transfusion with a rate of approximately 5% quoted for all Cesarean sections, risk of injury to adjacent organs including but not  limited to bowl and bladder, the need for additional surgical procedures to address such injuries, and the risk of infection.    2) Fetus - +FHT  3) PNL - Blood type B/Positive/-- (01/17 1457) / Anti-bodyscreen Negative (01/17 1457) / Rubella 5.06 (01/17 1457) / Varicella Immune / RPR Non Reactive (06/19 1533) / HBsAg Negative (01/17 1457) / HIV Non Reactive (06/19 1533) / 1-hr OGTT 90  / GBS Positive (08/04 1643)  4) Immunization History -  Immunization History  Administered Date(s) Administered  . Tdap 01/09/2019  5) Disposition - pending delivery   Malachy Mood, MD, [place], [facility] 03/03/2019, 10:58 AM

## 2019-03-03 NOTE — Patient Instructions (Signed)
Your procedure is scheduled on: 03/06/19  ARRIVAL TIME 0530 AM IN EMERGENCY DEPT  Remember: Instructions that are not followed completely may result in serious medical risk,  up to and including death, or upon the discretion of your surgeon and anesthesiologist your  surgery may need to be rescheduled.     _X__ 1. Do not eat food after midnight the night before your procedure.                 No gum chewing or hard candies. You may drink clear liquids up to 2 hours                 before you are scheduled to arrive for your surgery- DO not drink clear                 liquids within 2 hours of the start of your surgery.                 Clear Liquids include:  water, apple juice without pulp, clear carbohydrate                 drink such as Clearfast of Gatorade, Black Coffee or Tea (Do not add                 anything to coffee or tea).  __X__2.  On the morning of surgery brush your teeth with toothpaste and water, you                may rinse your mouth with mouthwash if you wish.  Do not swallow any toothpaste of mouthwash.     _X__ 3.  No Alcohol for 24 hours before or after surgery.   _X__ 4.  Do Not Smoke or use e-cigarettes For 24 Hours Prior to Your Surgery.                 Do not use any chewable tobacco products for at least 6 hours prior to                 surgery.  ____  5.  Bring all medications with you on the day of surgery if instructed.   __X__  6.  Notify your doctor if there is any change in your medical condition      (cold, fever, infections).     Do not wear jewelry, make-up, hairpins, clips or nail polish. Do not wear lotions, powders, or perfumes. You may wear deodorant. Do not shave 48 hours prior to surgery. Men may shave face and neck. Do not bring valuables to the hospital.    [facility] is not responsible for any belongings or valuables.  Contacts, dentures or bridgework may not be worn into surgery. Leave your suitcase in the car.  After surgery it may be brought to your room. For patients admitted to the hospital, discharge time is determined by your treatment team.   Patients discharged the day of surgery will not be allowed to drive home.   Please read over the following fact sheets that you were given:   Surgical Site Infection Prevention          ____ Take these medicines the morning of surgery with A SIP OF WATER:    1. NONE  2.   3.   4.  5.  6.  ____ Fleet Enema (as directed)   X____ Use CHG Soap as directed   DO NOT PUT ON BREASTS  ____ Use  inhalers on the day of surgery  ____ Stop metformin 2 days prior to surgery    ____ Take 1/2 of usual insulin dose the night before surgery. No insulin the morning          of surgery.   ____ Stop Coumadin/Plavix/aspirin on   ____ Stop Anti-inflammatories on    ____ Stop supplements until after surgery.    ____ Bring C-Pap to the hospital.

## 2019-03-04 ENCOUNTER — Other Ambulatory Visit
Admission: RE | Admit: 2019-03-04 | Discharge: 2019-03-04 | Disposition: A | Discharge status: Home / Self Care | Payer: BC Managed Care – PPO | Source: Ambulatory Visit | Attending: Obstetrics and Gynecology | Admitting: Obstetrics and Gynecology

## 2019-03-04 DIAGNOSIS — Z20828 Contact with and (suspected) exposure to other viral communicable diseases: Secondary | ICD-10-CM | POA: Diagnosis not present

## 2019-03-04 DIAGNOSIS — Z01812 Encounter for preprocedural laboratory examination: Secondary | ICD-10-CM | POA: Diagnosis not present

## 2019-03-05 LAB — SARS CORONAVIRUS 2 (TAT 6-24 HRS): SARS Coronavirus 2: NEGATIVE

## 2019-03-05 MED ORDER — CEFAZOLIN SODIUM-DEXTROSE 2-4 GM/100ML-% IV SOLN
2.0000 g | INTRAVENOUS | Status: AC
  Administered 2019-03-06: 08:00:00 2 g via INTRAVENOUS
  Filled 2019-03-05 (×2): qty 100

## 2019-03-06 ENCOUNTER — Inpatient Hospital Stay: Payer: BC Managed Care – PPO | Admitting: Anesthesiology

## 2019-03-06 ENCOUNTER — Inpatient Hospital Stay
Admission: RE | Admit: 2019-03-06 | Discharge: 2019-03-08 | DRG: 787 | Disposition: A | Discharge status: Home / Self Care | Payer: BC Managed Care – PPO | Attending: Obstetrics and Gynecology | Admitting: Obstetrics and Gynecology

## 2019-03-06 ENCOUNTER — Other Ambulatory Visit: Payer: Self-pay

## 2019-03-06 ENCOUNTER — Encounter
Admission: RE | Disposition: A | Discharge status: Home / Self Care | Payer: Self-pay | Source: Home / Self Care | Attending: Obstetrics and Gynecology

## 2019-03-06 DIAGNOSIS — B9620 Unspecified Escherichia coli [E. coli] as the cause of diseases classified elsewhere: Secondary | ICD-10-CM

## 2019-03-06 DIAGNOSIS — Z3A39 39 weeks gestation of pregnancy: Secondary | ICD-10-CM

## 2019-03-06 DIAGNOSIS — D62 Acute posthemorrhagic anemia: Secondary | ICD-10-CM | POA: Diagnosis not present

## 2019-03-06 DIAGNOSIS — N390 Urinary tract infection, site not specified: Secondary | ICD-10-CM

## 2019-03-06 DIAGNOSIS — O34211 Maternal care for low transverse scar from previous cesarean delivery: Principal | ICD-10-CM | POA: Diagnosis present

## 2019-03-06 DIAGNOSIS — O34219 Maternal care for unspecified type scar from previous cesarean delivery: Secondary | ICD-10-CM | POA: Diagnosis present

## 2019-03-06 DIAGNOSIS — O99824 Streptococcus B carrier state complicating childbirth: Secondary | ICD-10-CM

## 2019-03-06 DIAGNOSIS — Z3480 Encounter for supervision of other normal pregnancy, unspecified trimester: Secondary | ICD-10-CM

## 2019-03-06 DIAGNOSIS — O9081 Anemia of the puerperium: Secondary | ICD-10-CM | POA: Diagnosis not present

## 2019-03-06 LAB — CBC
HCT: 39.2 % (ref 36.0–46.0)
Hemoglobin: 13.4 g/dL (ref 12.0–15.0)
MCH: 30.6 pg (ref 26.0–34.0)
MCHC: 34.2 g/dL (ref 30.0–36.0)
MCV: 89.5 fL (ref 80.0–100.0)
Platelets: 181 10*3/uL (ref 150–400)
RBC: 4.38 MIL/uL (ref 3.87–5.11)
RDW: 14.6 % (ref 11.5–15.5)
WBC: 9.6 10*3/uL (ref 4.0–10.5)
nRBC: 0 % (ref 0.0–0.2)

## 2019-03-06 LAB — TYPE AND SCREEN
ABO/RH(D): B POS
Antibody Screen: NEGATIVE

## 2019-03-06 SURGERY — Surgical Case
Anesthesia: Spinal

## 2019-03-06 MED ORDER — FENTANYL CITRATE (PF) 100 MCG/2ML IJ SOLN: 25.0000 ug | INTRAMUSCULAR | Status: DC | PRN

## 2019-03-06 MED ORDER — KETOROLAC TROMETHAMINE 30 MG/ML IJ SOLN
INTRAMUSCULAR | Status: DC | PRN
  Administered 2019-03-06: 30 mg via INTRAVENOUS

## 2019-03-06 MED ORDER — NALBUPHINE HCL 10 MG/ML IJ SOLN: 5.0000 mg | Freq: Once | INTRAMUSCULAR | Status: DC | PRN

## 2019-03-06 MED ORDER — KETOROLAC TROMETHAMINE 30 MG/ML IJ SOLN: 30.0000 mg | Freq: Four times a day (QID) | INTRAMUSCULAR | Status: DC | PRN

## 2019-03-06 MED ORDER — NALOXONE HCL 4 MG/10ML IJ SOLN
1.0000 ug/kg/h | INTRAVENOUS | Status: DC | PRN
  Filled 2019-03-06: qty 5

## 2019-03-06 MED ORDER — MORPHINE SULFATE (PF) 0.5 MG/ML IJ SOLN
INTRAMUSCULAR | Status: DC | PRN
  Administered 2019-03-06 (×2): 2 mg via INTRAVENOUS
  Administered 2019-03-06: .9 mg via EPIDURAL
  Administered 2019-03-06: .1 mg via INTRATHECAL

## 2019-03-06 MED ORDER — ONDANSETRON HCL 4 MG/2ML IJ SOLN
INTRAMUSCULAR | Status: AC
  Filled 2019-03-06: qty 2

## 2019-03-06 MED ORDER — ONDANSETRON HCL 4 MG/2ML IJ SOLN
INTRAMUSCULAR | Status: DC | PRN
  Administered 2019-03-06: 4 mg via INTRAVENOUS

## 2019-03-06 MED ORDER — PROPOFOL 500 MG/50ML IV EMUL
INTRAVENOUS | Status: DC | PRN
  Administered 2019-03-06: 30 ug/kg/min via INTRAVENOUS

## 2019-03-06 MED ORDER — SODIUM CHLORIDE 0.9% FLUSH: 3.0000 mL | INTRAVENOUS | Status: DC | PRN

## 2019-03-06 MED ORDER — MENTHOL 3 MG MT LOZG
1.0000 | LOZENGE | OROMUCOSAL | Status: DC | PRN
  Filled 2019-03-06: qty 9

## 2019-03-06 MED ORDER — PHENYLEPHRINE HCL (PRESSORS) 10 MG/ML IV SOLN
INTRAVENOUS | Status: AC
  Filled 2019-03-06: qty 1

## 2019-03-06 MED ORDER — COCONUT OIL OIL
1.0000 "application " | TOPICAL_OIL | Status: DC | PRN
  Administered 2019-03-06: 1 via TOPICAL
  Filled 2019-03-06: qty 120

## 2019-03-06 MED ORDER — MORPHINE SULFATE (PF) 0.5 MG/ML IJ SOLN
INTRAMUSCULAR | Status: AC
  Filled 2019-03-06: qty 10

## 2019-03-06 MED ORDER — IBUPROFEN 800 MG PO TABS: 800.0000 mg | ORAL_TABLET | Freq: Three times a day (TID) | ORAL | Status: DC

## 2019-03-06 MED ORDER — NALBUPHINE HCL 10 MG/ML IJ SOLN: 5.0000 mg | INTRAMUSCULAR | Status: DC | PRN

## 2019-03-06 MED ORDER — FAMOTIDINE 20 MG PO TABS
20.0000 mg | ORAL_TABLET | Freq: Once | ORAL | Status: DC
  Filled 2019-03-06: qty 1

## 2019-03-06 MED ORDER — PRENATAL MULTIVITAMIN CH
1.0000 | ORAL_TABLET | Freq: Every day | ORAL | Status: DC
  Administered 2019-03-06 – 2019-03-07 (×2): 1 via ORAL
  Filled 2019-03-06 (×3): qty 1

## 2019-03-06 MED ORDER — NALOXONE HCL 0.4 MG/ML IJ SOLN: 0.4000 mg | INTRAMUSCULAR | Status: DC | PRN

## 2019-03-06 MED ORDER — SODIUM CHLORIDE 0.9 % IV BOLUS
500.0000 mL | Freq: Once | INTRAVENOUS | Status: AC
  Administered 2019-03-06: 18:00:00 500 mL via INTRAVENOUS

## 2019-03-06 MED ORDER — NALBUPHINE HCL 10 MG/ML IJ SOLN
5.0000 mg | Freq: Once | INTRAMUSCULAR | Status: AC | PRN
  Administered 2019-03-06: 5 mg via INTRAVENOUS
  Filled 2019-03-06: qty 1

## 2019-03-06 MED ORDER — PROPOFOL 10 MG/ML IV BOLUS
INTRAVENOUS | Status: AC
  Filled 2019-03-06: qty 20

## 2019-03-06 MED ORDER — OXYTOCIN 40 UNITS IN NORMAL SALINE INFUSION - SIMPLE MED
INTRAVENOUS | Status: AC
  Filled 2019-03-06: qty 1000

## 2019-03-06 MED ORDER — HYDROMORPHONE HCL 2 MG PO TABS
2.0000 mg | ORAL_TABLET | ORAL | Status: DC | PRN
  Filled 2019-03-06: qty 2

## 2019-03-06 MED ORDER — SIMETHICONE 80 MG PO CHEW
80.0000 mg | CHEWABLE_TABLET | ORAL | Status: DC
  Filled 2019-03-06: qty 1

## 2019-03-06 MED ORDER — DIPHENHYDRAMINE HCL 25 MG PO CAPS: 25.0000 mg | ORAL_CAPSULE | Freq: Four times a day (QID) | ORAL | Status: DC | PRN

## 2019-03-06 MED ORDER — WITCH HAZEL-GLYCERIN EX PADS: 1.0000 "application " | MEDICATED_PAD | CUTANEOUS | Status: DC | PRN

## 2019-03-06 MED ORDER — DIBUCAINE (PERIANAL) 1 % EX OINT: 1.0000 "application " | TOPICAL_OINTMENT | CUTANEOUS | Status: DC | PRN

## 2019-03-06 MED ORDER — DIPHENHYDRAMINE HCL 25 MG PO CAPS: 25.0000 mg | ORAL_CAPSULE | ORAL | Status: DC | PRN

## 2019-03-06 MED ORDER — BUPIVACAINE HCL (PF) 0.5 % IJ SOLN
INTRAMUSCULAR | Status: DC | PRN
  Administered 2019-03-06: 10 mL

## 2019-03-06 MED ORDER — SOD CITRATE-CITRIC ACID 500-334 MG/5ML PO SOLN
30.0000 mL | ORAL | Status: AC
  Administered 2019-03-06: 30 mL via ORAL
  Filled 2019-03-06: qty 30

## 2019-03-06 MED ORDER — SODIUM CHLORIDE 0.9 % IV SOLN
INTRAVENOUS | Status: DC | PRN
  Administered 2019-03-06: 30 ug/min via INTRAVENOUS

## 2019-03-06 MED ORDER — LACTATED RINGERS IV SOLN: INTRAVENOUS | Status: DC

## 2019-03-06 MED ORDER — KETOROLAC TROMETHAMINE 30 MG/ML IJ SOLN
30.0000 mg | Freq: Four times a day (QID) | INTRAMUSCULAR | Status: AC | PRN
  Administered 2019-03-06 (×2): 30 mg via INTRAVENOUS
  Filled 2019-03-06 (×2): qty 1

## 2019-03-06 MED ORDER — MEPERIDINE HCL 25 MG/ML IJ SOLN: 6.2500 mg | INTRAMUSCULAR | Status: DC | PRN

## 2019-03-06 MED ORDER — DIPHENHYDRAMINE HCL 50 MG/ML IJ SOLN: 12.5000 mg | INTRAMUSCULAR | Status: DC | PRN

## 2019-03-06 MED ORDER — ACETAMINOPHEN 500 MG PO TABS
1000.0000 mg | ORAL_TABLET | Freq: Four times a day (QID) | ORAL | Status: AC
  Administered 2019-03-06 – 2019-03-07 (×4): 1000 mg via ORAL
  Filled 2019-03-06 (×4): qty 2

## 2019-03-06 MED ORDER — BUPIVACAINE HCL (PF) 0.5 % IJ SOLN
20.0000 mL | INTRAMUSCULAR | Status: DC
  Filled 2019-03-06: qty 30

## 2019-03-06 MED ORDER — BUPIVACAINE 0.25 % ON-Q PUMP DUAL CATH 400 ML
400.0000 mL | INJECTION | Status: DC
  Filled 2019-03-06: qty 400

## 2019-03-06 MED ORDER — KETOROLAC TROMETHAMINE 30 MG/ML IJ SOLN
INTRAMUSCULAR | Status: AC
  Filled 2019-03-06: qty 1

## 2019-03-06 MED ORDER — SENNOSIDES-DOCUSATE SODIUM 8.6-50 MG PO TABS
2.0000 | ORAL_TABLET | ORAL | Status: DC
  Administered 2019-03-07 – 2019-03-08 (×2): 2 via ORAL
  Filled 2019-03-06 (×2): qty 2

## 2019-03-06 MED ORDER — ONDANSETRON HCL 4 MG/2ML IJ SOLN: 4.0000 mg | Freq: Once | INTRAMUSCULAR | Status: DC | PRN

## 2019-03-06 MED ORDER — NALBUPHINE HCL 10 MG/ML IJ SOLN
5.0000 mg | INTRAMUSCULAR | Status: DC | PRN
  Administered 2019-03-06: 5 mg via INTRAVENOUS
  Filled 2019-03-06: qty 1

## 2019-03-06 MED ORDER — OXYTOCIN 40 UNITS IN NORMAL SALINE INFUSION - SIMPLE MED
INTRAVENOUS | Status: DC | PRN
  Administered 2019-03-06: 500 mL via INTRAVENOUS

## 2019-03-06 MED ORDER — SIMETHICONE 80 MG PO CHEW: 80.0000 mg | CHEWABLE_TABLET | ORAL | Status: DC | PRN

## 2019-03-06 MED ORDER — LACTATED RINGERS IV SOLN
INTRAVENOUS | Status: DC
  Administered 2019-03-06: 07:00:00 via INTRAVENOUS

## 2019-03-06 MED ORDER — PHENYLEPHRINE HCL (PRESSORS) 10 MG/ML IV SOLN
INTRAVENOUS | Status: DC | PRN
  Administered 2019-03-06: 100 ug via INTRAVENOUS
  Administered 2019-03-06: 150 ug via INTRAVENOUS
  Administered 2019-03-06 (×2): 100 ug via INTRAVENOUS

## 2019-03-06 MED ORDER — SIMETHICONE 80 MG PO CHEW
80.0000 mg | CHEWABLE_TABLET | Freq: Three times a day (TID) | ORAL | Status: DC
  Administered 2019-03-06 – 2019-03-08 (×6): 80 mg via ORAL
  Filled 2019-03-06 (×6): qty 1

## 2019-03-06 MED ORDER — SERTRALINE HCL 25 MG PO TABS
50.0000 mg | ORAL_TABLET | Freq: Every day | ORAL | Status: DC
  Administered 2019-03-06 – 2019-03-07 (×2): 50 mg via ORAL
  Filled 2019-03-06 (×2): qty 2
  Filled 2019-03-06: qty 1

## 2019-03-06 MED ORDER — OXYTOCIN 40 UNITS IN NORMAL SALINE INFUSION - SIMPLE MED: 2.5000 [IU]/h | INTRAVENOUS | Status: DC

## 2019-03-06 SURGICAL SUPPLY — 39 items
ADH SKN CLS APL DERMABOND .7 (GAUZE/BANDAGES/DRESSINGS) ×1
APL PRP STRL LF DISP 70% ISPRP (MISCELLANEOUS) ×2
BAG COUNTER SPONGE EZ (MISCELLANEOUS) ×2 IMPLANT
BAG SPNG 4X4 CLR HAZ (MISCELLANEOUS) ×1
CANISTER SUCT 3000ML PPV (MISCELLANEOUS) ×3 IMPLANT
CATH KIT ON-Q SILVERSOAK 5 (CATHETERS) ×2 IMPLANT
CATH KIT ON-Q SILVERSOAK 5IN (CATHETERS) ×6 IMPLANT
CHLORAPREP W/TINT 26 (MISCELLANEOUS) ×6 IMPLANT
CLOSURE WOUND 1/2 X4 (GAUZE/BANDAGES/DRESSINGS) ×1
COUNTER SPONGE BAG EZ (MISCELLANEOUS) ×1
DERMABOND ADVANCED (GAUZE/BANDAGES/DRESSINGS) ×2
DERMABOND ADVANCED .7 DNX12 (GAUZE/BANDAGES/DRESSINGS) ×1 IMPLANT
DRESSING SURGICEL FIBRLLR 1X2 (HEMOSTASIS) IMPLANT
DRSG OPSITE POSTOP 4X10 (GAUZE/BANDAGES/DRESSINGS) ×3 IMPLANT
DRSG SURGICEL FIBRILLAR 1X2 (HEMOSTASIS) ×3
DRSG TELFA 3X8 NADH (GAUZE/BANDAGES/DRESSINGS) ×3 IMPLANT
ELECT CAUTERY BLADE 6.4 (BLADE) ×3 IMPLANT
ELECT REM PT RETURN 9FT ADLT (ELECTROSURGICAL) ×3
ELECTRODE REM PT RTRN 9FT ADLT (ELECTROSURGICAL) ×1 IMPLANT
GAUZE SPONGE 4X4 12PLY STRL (GAUZE/BANDAGES/DRESSINGS) ×3 IMPLANT
GLOVE BIO SURGEON STRL SZ7 (GLOVE) ×7 IMPLANT
GLOVE INDICATOR 7.5 STRL GRN (GLOVE) ×7 IMPLANT
GOWN STRL REUS W/ TWL LRG LVL3 (GOWN DISPOSABLE) ×3 IMPLANT
GOWN STRL REUS W/TWL LRG LVL3 (GOWN DISPOSABLE) ×9
NS IRRIG 1000ML POUR BTL (IV SOLUTION) ×3 IMPLANT
PACK C SECTION AR (MISCELLANEOUS) ×3 IMPLANT
PAD DRESSING TELFA 3X8 NADH (GAUZE/BANDAGES/DRESSINGS) ×1 IMPLANT
PAD OB MATERNITY 4.3X12.25 (PERSONAL CARE ITEMS) ×3 IMPLANT
PAD PREP 24X41 OB/GYN DISP (PERSONAL CARE ITEMS) ×3 IMPLANT
PENCIL SMOKE ULTRAEVAC 22 CON (MISCELLANEOUS) ×3 IMPLANT
STRIP CLOSURE SKIN 1/2X4 (GAUZE/BANDAGES/DRESSINGS) ×2 IMPLANT
SUT MNCRL 4-0 (SUTURE) ×3
SUT MNCRL 4-0 27XMFL (SUTURE) ×1
SUT MNCRL AB 4-0 PS2 18 (SUTURE) ×1 IMPLANT
SUT PDS AB 1 TP1 96 (SUTURE) ×6 IMPLANT
SUT VIC AB 0 CTX 36 (SUTURE) ×3
SUT VIC AB 0 CTX36XBRD ANBCTRL (SUTURE) ×2 IMPLANT
SUT VIC AB 2-0 CT1 36 (SUTURE) ×3 IMPLANT
SUTURE MNCRL 4-0 27XMF (SUTURE) IMPLANT

## 2019-03-06 NOTE — Op Note (Signed)
Preoperative Diagnosis: 1) 33 y.o. [id number] at [redacted]w[redacted]d 2) History of prior cesarean section for active phase arrest  Postoperative Diagnosis: 1) 33 y.o. 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  Operation Performed: Repeat high transverse C-section via pfannenstiel skin incision, lysis of adhesions  Indiciation: Elective repeat  Anesthesia: Spinal  Primary Surgeon: Malachy Mood, MD   Assistant: Barnett Applebaum, MD this surgery required a high level surgical assistant with none other readily available  Preoperative Antibiotics: 2g Ancef  Estimated Blood Loss: 905 mL  IV Fluids: 1L  Urine Output:: 74mL of clear urine  Drains or Tubes: Foley to gravity drainage, ON-Q catheter system  Implants: none  Specimens Removed: none  Complications: none  Intraoperative Findings:  Normal tubes ovaries and uterus.  Delivery resulted in the birth of a liveborn female, APGAR (1 MIN): 9   APGAR (5 MINS): 9, weight 7lbs 14oz  No rectus fascia scarring encountered.  On entering the midline, dense adhesions were noted of the rectus muscle to the bladder and uterus in the lower uterine segment.  The bladder was unable to be fully mobilized of the uterine segment.  The incision therefore was taken high than usual and involved the contractile portion of the uterus.  This incision should be treated as a classical with subsequent deliveries, and recommendation would be for vertical midline skin incision in order to avoid the lower uterine segment scar tissue.  Patient Condition:stable  Procedure in Detail:  Patient was taken to the operating room were she was administered regional anesthesia.  She was positioned in the supine position, prepped and draped in the  Usual sterile fashion.  Prior to proceeding with the case a time out was performed and the level of anesthetic was checked and noted to  be adequate.  Utilizing the scalpel a pfannenstiel skin incision was made 2cm above the pubic symphysis utilizing the patient's pre-existing scar and carried down sharply to the the level of the rectus fascia.  The fascia was incised in the midline using the scalpel and then extended using mayo scissors.  The superior border of the rectus fascia was grasped with two Kocher clamps and the underlying rectus muscles were dissected of the fascia using blunt dissection.  The median raphae was incised using Mayo scissors.   The inferior border of the rectus fascia was dissected of the rectus muscles in a similar fashion.  The midline was identified, the peritoneum was entered bluntly and expanded using manual tractions.  At this time it was noted that there was dense adhesive disease involving the lower uterine segment, bladder and rectus muscle.  Using the operators finger the adhesions were able to be taken down of the rectus muscle using the Bovie. This allowed lateral acces to the lower uterine segment, an allowed the adhesion to be partially undermined.  Bovie was used on top of the operators finger to peel the adhesions caudally.  However, given density of the adhesions the bladder was not able to be fully mobilized off the lower uterine segment. T  Next the bladder blade was placed retracting the bladder caudally.   A  high transverse incision was scored on the lower uterine segment.  The hysterotomy was entered bluntly using the operators finger.  The hysterotomy incision was extended using manual traction.  The operators hand was placed within the hysterotomy position noting the fetus to be within the OA  position.  The vertex was grasped, flexed, brought to the incision, and delivered a traumatically using fundal pressure.  The remainder of the body delivered with ease.  The infant was suctioned, cord was clamped and cut before handing off to the awaiting neonatologist.  The placenta was delivered using manual  extraction.  The uterus was exteriorized, wiped clean of clots and debris using two moist laps.  The hysterotomy was closed using a two layer closure of 0 Vicryl, with the first being a running locked, the second a vertical imbricating.  A third layer of 0 Vicryl was placed to achieve hemostasis in the lower uterine segment and fibrillar was placed over the hysterotomy. The uterus was returned to the abdomen.  The peritoneal gutters were wiped clean of clots and debris using two moist laps.  The hysterotomy incision was re-inspected noted to be hemostatic.  The rectus muscles were re-approximated in the midline using a single 2-0 Vicryl mattress stitch.  The rectus muscles were inspected noted to be hemostatic.  The superior border of the rectus fascia was grasped with a Kocher clamp.  The ON-Q trocars were then placed 4cm above the superior border of the incision and tunneled subfascially.  The introducers were removed and the catheters were threaded through the sleeves after which the sleeves were removed.  The fascia was closed using a looped #1 PDS in a running fashion taking 1cm by 1cm bites.  The subcutaneous tissue was irrigated using warm saline, hemostasis achieved using the bovie.  The subcutaneous dead space was less than 3cm and was not closed.  The skin was closed using 4-0 Monocryl in a subcuticular fashion.  Sponge needle and instrument counts were corrects times two.  The patient tolerated the procedure well and was taken to the recovery room in stable condition.

## 2019-03-06 NOTE — Progress Notes (Signed)
Urine output borderline (33ml/hr) since admission to [facility] at noon. Stood at side of bed, peri and foley care. Encouraged pt to drink water and will revaluate urine output in the next couple of hours to see if we can remove foley per protocol.

## 2019-03-06 NOTE — Transfer of Care (Signed)
Immediate Anesthesia Transfer of Care Note  Patient: Roberta Coleman  Procedure(s) Performed: CESAREAN SECTION (N/A )  Patient Location: PACU and Mother/Baby  Anesthesia Type:Spinal  Level of Consciousness: awake and patient cooperative  Airway & Oxygen Therapy: Patient Spontanous Breathing and Patient connected to nasal cannula oxygen  Post-op Assessment: Report given to RN and Post -op Vital signs reviewed and stable  Post vital signs: stable  Last Vitals:  Vitals Value Taken Time  BP 120/62 03/06/19 0918  Temp    Pulse    Resp    SpO2 100 % 03/06/19 0918    Last Pain:  Vitals:   03/06/19 0715  TempSrc:   PainSc: 0-No pain         Complications: No apparent anesthesia complications

## 2019-03-06 NOTE — Lactation Note (Signed)
This note was copied from a baby's chart. Lactation Consultation Note  Patient Name: Roberta Coleman [id number]'I Date: 03/06/2019 Reason for consult: Follow-up assessment;Mother's request;Difficult latch   Maternal Data Does the patient have breastfeeding experience prior to this delivery?: Yes  Feeding Feeding Type: Breast Fed  LATCH Score Latch: Repeated attempts needed to sustain latch, nipple held in mouth throughout feeding, stimulation needed to elicit sucking reflex.  Audible Swallowing: A few with stimulation  Type of Nipple: Inverted  Comfort (Breast/Nipple): Soft / non-tender  Hold (Positioning): Full assist, staff holds infant at breast  LATCH Score: 4  Interventions Interventions: Breast feeding basics reviewed;Assisted with latch;Skin to skin;Breast massage;Hand express;Pre-pump if needed;Adjust position;Breast compression;Support pillows   Parents were about to call to assist with breastfeeding.  While dad changed baby's diaper, LC helped mom to pre-pump.  Baby was not opening mouth wide enough to get a deep latch on left inverted nipple so mother requested we tried the right side.  Attempt was made to latch, leading to the use of a nipple shield.  Mom was able to feel tugs through the shield.  Mom and baby were adjusted so that baby fell back from the breast since she was burying her nose in mom's breast tissue and popping off as a result.  Creating an air pocket helped.  Baby fed for almost 10 min and popped off content.  When placed skin to skin, she cued so LC offered the pre-pumped milk (67mL) in a wide mouthed cup.  Baby drank all of it, was placed skin to skin, and fell asleep.  Mom demonstrated positioning and putting on the shield by herself and feels confident in her ability.  She will openly ask for help when needed and does seem overwhelmed that "breastfeeding is so much work." But she still wants to breastfeed over EP.  [facility] intern reviewed breast sandwiching and  compressions to help sustain a latch before leaving.  Lactation Tools Discussed/Used Tools: Feeding cup;Nipple Shields Nipple shield size: 20 Breast pump type: Manual Pump Review: Setup, frequency, and cleaning Initiated by:: [place] Intern Date initiated:: 03/06/19   Consult Status Consult Status: Follow-up Date: 03/06/19 Follow-up type: In-patient    [name] November 03/06/2019, 4:27 PM

## 2019-03-06 NOTE — Anesthesia Preprocedure Evaluation (Addendum)
Anesthesia Evaluation  Patient identified by MRN, date of birth, ID band Patient awake    Reviewed: Allergy & Precautions, NPO status , Patient's Chart, lab work & pertinent test results  History of Anesthesia Complications Negative for: history of anesthetic complications  Airway Mallampati: II       Dental   Pulmonary neg sleep apnea, neg COPD, Not current smoker,           Cardiovascular (-) hypertension(-) Past MI and (-) CHF (-) dysrhythmias (-) Valvular Problems/Murmurs     Neuro/Psych neg Seizures Anxiety    GI/Hepatic Neg liver ROS, GERD (with pregnancy)  ,  Endo/Other  neg diabetes  Renal/GU negative Renal ROS     Musculoskeletal   Abdominal   Peds  Hematology   Anesthesia Other Findings   Reproductive/Obstetrics (+) Pregnancy                            Anesthesia Physical Anesthesia Plan  ASA: II  Anesthesia Plan: Spinal   Post-op Pain Management:    Induction:   PONV Risk Score and Plan:   Airway Management Planned:   Additional Equipment:   Intra-op Plan:   Post-operative Plan:   Informed Consent: I have reviewed the patients History and Physical, chart, labs and discussed the procedure including the risks, benefits and alternatives for the proposed anesthesia with the patient or authorized representative who has indicated his/her understanding and acceptance.       Plan Discussed with:   Anesthesia Plan Comments:         Anesthesia Quick Evaluation

## 2019-03-06 NOTE — Lactation Note (Signed)
This note was copied from a baby's chart. Lactation Consultation Note  Patient Name: Roberta Coleman Date: 03/06/2019 Reason for consult: Follow-up assessment;Difficult latch   Maternal Data    Feeding Feeding Type: Breast Fed   Interventions Interventions: Breast feeding basics reviewed;Skin to skin;Pre-pump if needed;Hand pump  LC intern checked in on mom and baby to see if baby was interested in nursing.  While dad unswaddled and undressed baby, [place] intern showed mom how to erect nipples with a manual pump prior to feeding.  Pre-pumping was very successful but baby was uninterested in nursing.  As a result, mom was told to keep baby skin to skin as much as possible to increase breastfeeding opportunities.  A spoon and wide mouthed cup were left to feed baby any pumped colostrum from mom pre-pumping.  [facility] intern educated parents on the importance of delaying bottle introduction until breastfeeding was well-established, near the 4-6 week mark.  The parents felt confident in their near intervention and were excited to try pre-pumping when baby expressed feeding cues.  Lactation Tools Discussed/Used Tools: Pump Breast pump type: Manual(use manual pump before breastfeeding to erect nipple) Pump Review: Setup, frequency, and cleaning Initiated by:: [place] Intern Date initiated:: 03/06/19   Consult Status Consult Status: Follow-up Date: 03/06/19 Follow-up type: In-patient    [name] November 03/06/2019, 3:18 PM

## 2019-03-06 NOTE — Discharge Summary (Signed)
OB Discharge Summary     Patient Name: Roberta Coleman DOB: 03/10/1986 MRN: [id number]  Date of admission: 03/06/2019 Delivering MD: Vena AustriaSTAEBLER, ANDREAS   Date of discharge: 03/08/2019  Admitting diagnosis: O34.219 HISTORY OF PRIOR CESAREAN SECTION Intrauterine pregnancy: 5550w0d     Secondary diagnosis:  Active Problems:   Uterine scar from previous cesarean delivery affecting pregnancy  Additional problems: none     Discharge diagnosis: Term Pregnancy Delivered                                                                                                Post partum procedures: None  Augmentation: N/A  Complications: Adhesions necessitated uterine incision involving contractile portion of uterus, treat as classical incision for subsequent deliveries  Hospital course:  Scheduled C/S   33 y.o. yo [id number] at 2150w0d was admitted to the hospital 03/06/2019 for scheduled cesarean section with the following indication:Elective Repeat.  Membrane Rupture Time/Date: 8:20 AM ,03/06/2019   Patient delivered a Viable infant.03/06/2019  Details of operation can be found in separate operative note.  Pateint had an uncomplicated postpartum course.  She is ambulating, tolerating a regular diet, passing flatus, and urinating well. Patient is discharged home in stable condition on  03/08/19         Physical exam  Vitals:   03/07/19 0800 03/07/19 1528 03/07/19 2320 03/08/19 0901  BP:  128/83 119/77 123/85  Pulse: 95 91 94 82  Resp:  18 18 20   Temp:  98 F (36.7 C) 98.6 F (37 C) 98.3 F (36.8 C)  TempSrc:  Oral Oral Oral  SpO2: 99% 96% 97% 98%  Weight:      Height:       General: alert, cooperative and no distress Lochia: appropriate Uterine Fundus: firm Incision: Dressing is clean, dry, and intact; On-Q pump site was leaking and dressing was changed DVT Evaluation: No evidence of DVT seen on physical exam.  Labs: Lab Results  Component Value Date   WBC 13.1 (H) 03/07/2019   HGB  11.6 (L) 03/07/2019   HCT 34.0 (L) 03/07/2019   MCV 91.9 03/07/2019   PLT 140 (L) 03/07/2019   CMP Latest Ref Rng & Units 01/30/2018  Glucose 70 - 99 mg/dL 86  BUN 6 - 20 mg/dL 13  Creatinine 5.780.44 - 4.691.00 mg/dL 6.290.74  Sodium 528135 - 413145 mmol/L 137  Potassium 3.5 - 5.1 mmol/L 4.0  Chloride 98 - 111 mmol/L 102  CO2 22 - 32 mmol/L 23  Calcium 8.9 - 10.3 mg/dL 9.4  Total Protein 6.5 - 8.1 g/dL 8.3(H)  Total Bilirubin 0.3 - 1.2 mg/dL 0.9  Alkaline Phos 38 - 126 U/L 47  AST 15 - 41 U/L 20  ALT 0 - 44 U/L 27    Discharge instruction: per After Visit Summary and "Baby and Me Booklet".  After visit meds:  Allergies as of 03/08/2019      Reactions   Oxycodone Hcl Itching      Medication List    STOP taking these medications   acetaminophen 325 MG tablet Commonly known as: TYLENOL  TAKE these medications   HYDROmorphone 2 MG tablet Commonly known as: DILAUDID Take 1 tablet (2 mg total) by mouth every 4 (four) hours as needed for severe pain.   prenatal vitamin w/FE, FA 27-1 MG Tabs tablet Take 1 tablet by mouth daily at 12 noon.   sertraline 50 MG tablet Commonly known as: ZOLOFT TAKE 1 TABLET BY MOUTH EVERY DAY What changed: when to take this            Discharge Care Instructions  (From admission, onward)         Start     Ordered   03/08/19 0000  Discharge wound care:    Comments: You may apply a light dressing for minor discharge from the incision or to keep waistbands of clothing from rubbing.  You may also have been discharge with a clear dressing in which case this will be removed at your postoperative clinic visit.  You may shower, use soap on your incision.  Avoid baths or soaking the incision in the first 6 weeks following your surgery.Marland Kitchen   03/08/19 0957          Diet: routine diet  Activity: Advance as tolerated. Pelvic rest for 6 weeks.   Outpatient follow up:1 week Follow up Appt: Future Appointments  Date Time Provider [place]   03/14/2019  2:10 PM Malachy Mood, MD [facility] None   Follow up Visit:No follow-ups on file.  Postpartum contraception: Vasectomy  Newborn Data: Live born female "Palmer" Birth Weight: 7 lb 13.2 oz (3550 g) APGAR: 22, 9  Newborn Delivery   Birth date/time: 03/06/2019 08:20:00 Delivery type: C-Section, Low Transverse Trial of labor: No C-section categorization: Repeat      Baby Feeding: Breast Disposition: home with mother   03/08/2019 Rexene Agent, CNM

## 2019-03-06 NOTE — Anesthesia Post-op Follow-up Note (Signed)
Anesthesia QCDR form completed.        

## 2019-03-06 NOTE — H&P (Signed)
Date of Initial H&P: 03/03/2019  History reviewed, patient examined, no change in status, stable for surgery.

## 2019-03-06 NOTE — Lactation Note (Signed)
Lactation Consultation Note  Patient Name: Roberta Coleman [id number] Date: 03/06/2019     Maternal Data Has patient been taught Hand Expression?: Yes Does the patient have breastfeeding experience prior to this delivery?: Yes  Feeding Feeding Type: Breast Fed  LATCH Score Latch: Repeated attempts needed to sustain latch, nipple held in mouth throughout feeding, stimulation needed to elicit sucking reflex.  Audible Swallowing: A few with stimulation  Type of Nipple: Inverted(inverts when compressed)  Comfort (Breast/Nipple): Soft / non-tender  Hold (Positioning): Full assist, staff holds infant at breast  LATCH Score: 4  Interventions Interventions: Breast feeding basics reviewed;Assisted with latch;Skin to skin;Breast massage;Hand express;Breast compression;Support pillows;Adjust position  Lactation Tools Discussed/Used Tools: Nipple Shields(attempted; latched without) Nipple shield size: 20   Consult Status Consult Status: Follow-up Date: 03/06/19 Follow-up type: In-patient    [name] November 03/06/2019, 11:45 AM

## 2019-03-06 NOTE — Lactation Note (Signed)
This note was copied from a baby's chart. Lactation Consultation Note  Patient Name: Roberta Coleman Today's Date: 03/06/2019 Reason for consult: Initial assessment   Maternal Data Has patient been taught Hand Expression?: Yes Does the patient have breastfeeding experience prior to this delivery?: Yes  Feeding Feeding Type: Breast Fed  LATCH Score Latch: Repeated attempts needed to sustain latch, nipple held in mouth throughout feeding, stimulation needed to elicit sucking reflex.  Audible Swallowing: A few with stimulation  Type of Nipple: Inverted(inverts with compression)  Comfort (Breast/Nipple): Soft / non-tender  Hold (Positioning): Full assist, staff holds infant at breast  LATCH Score: 4  Interventions Interventions: Breast feeding basics reviewed;Assisted with latch;Breast compression;Skin to skin;Adjust position;Breast massage;Hand express   LC was called due to difficulty latching, complains of baby popping on and off the breast.  Upon assessment, LC intern noticed that mom's nipples invert upon compression.  R nipple is more flat and also inverts.  When breast is compressed baby is able to sustain a latch for a couple of minutes before popping off.  Suck is strong and swallows are heard when latched.  After baby pops off, she is placed skin to skin and begins cuing.  Attempts were made on both sides with and without a nipple shield. Mom complained of pinching and pain with the shield and when bottom lip was tucked in.  When breast is sandwiched closer to the areola baby is able to sustain a latch, but lip must be flanged out.  Mom was assured that this is new for [facility] and that practice will be key.  Mom has plenty of colostrum and said she exclusively pumped for her first child because of nipple pain.  She would like to breastfeed exclusively this time with the occasional bottle when necessary.  Mom will be visited after being moved to the [facility] and is prompted to call  with questions or when baby cues again.  Lactation Tools Discussed/Used Tools: Nipple Shields Nipple shield size: 20(latched without shield)   Consult Status Consult Status: Follow-up Date: 03/06/19 Follow-up type: In-patient    [name] November 03/06/2019, 11:51 AM

## 2019-03-07 ENCOUNTER — Encounter: Payer: Self-pay | Admitting: Obstetrics and Gynecology

## 2019-03-07 LAB — CBC
HCT: 34 % — ABNORMAL LOW (ref 36.0–46.0)
Hemoglobin: 11.6 g/dL — ABNORMAL LOW (ref 12.0–15.0)
MCH: 31.4 pg (ref 26.0–34.0)
MCHC: 34.1 g/dL (ref 30.0–36.0)
MCV: 91.9 fL (ref 80.0–100.0)
Platelets: 140 10*3/uL — ABNORMAL LOW (ref 150–400)
RBC: 3.7 MIL/uL — ABNORMAL LOW (ref 3.87–5.11)
RDW: 14.8 % (ref 11.5–15.5)
WBC: 13.1 10*3/uL — ABNORMAL HIGH (ref 4.0–10.5)
nRBC: 0 % (ref 0.0–0.2)

## 2019-03-07 MED ORDER — HYDROMORPHONE HCL 2 MG PO TABS
2.0000 mg | ORAL_TABLET | ORAL | Status: DC | PRN
  Administered 2019-03-07 – 2019-03-08 (×4): 2 mg via ORAL
  Filled 2019-03-07 (×4): qty 1

## 2019-03-07 MED ORDER — IBUPROFEN 800 MG PO TABS
800.0000 mg | ORAL_TABLET | Freq: Three times a day (TID) | ORAL | Status: DC
  Administered 2019-03-07 – 2019-03-08 (×5): 800 mg via ORAL
  Filled 2019-03-07 (×5): qty 1

## 2019-03-07 MED ORDER — ACETAMINOPHEN 325 MG PO TABS
650.0000 mg | ORAL_TABLET | Freq: Four times a day (QID) | ORAL | Status: DC | PRN
  Administered 2019-03-07 – 2019-03-08 (×4): 650 mg via ORAL
  Filled 2019-03-07 (×4): qty 2

## 2019-03-07 NOTE — Anesthesia Postprocedure Evaluation (Signed)
Anesthesia Post Note  Patient: Roberta Coleman  Procedure(s) Performed: CESAREAN SECTION (N/A )  Patient location during evaluation: Mother Baby Anesthesia Type: Spinal Level of consciousness: oriented and awake and alert Pain management: pain level controlled Vital Signs Assessment: post-procedure vital signs reviewed and stable Respiratory status: spontaneous breathing and respiratory function stable Cardiovascular status: blood pressure returned to baseline and stable Postop Assessment: no headache, no backache, no apparent nausea or vomiting and able to ambulate Anesthetic complications: no     Last Vitals:  Vitals:   03/07/19 0300 03/07/19 0500  BP:    Pulse:    Resp:    Temp:    SpO2: 99% 98%    Last Pain:  Vitals:   03/06/19 2345  TempSrc:   PainSc: Asleep                 Hedda Slade

## 2019-03-07 NOTE — Lactation Note (Signed)
This note was copied from a baby's chart. Lactation Consultation Note  Patient Name: Roberta Coleman Today's Date: 03/07/2019   Assisted mom with comfortable position with pillow support in football hold.  Demonstrated hand expression of colostrum to entice Roberta Coleman to latch and to rub on nipples after breast feed to prevent bacteria, lubricate and for discomfort.  Mom has small flat nipples with firm areola that barely evert with compression.  Mom could achieve latch without nipple shield but Roberta Coleman could not sustain latch and mom was having excruciating nipple pain.  With #20 nipple shield, Roberta Coleman could maintain the latch for long periods and mom's discomfort was less.  Mom's nipple discomfort is worse when she initially latches and begins to ease up some a minute or 2 into breast feed.  Mom is already using comfort gels and coconut oil.  Mom has been trying to breast feed without nipple shield, but cannot get her to maintain the latch.  Mom mostly pumped with first baby d/t latch issues for 8 months and stored up enough milk for her first to get her breast milk up to a year.  She reported that once he started getting bottles, she could not get him to go back to the breast.  If Roberta Coleman does not breast feed well then she hand expresses and pumps and gives expressed milk via cup or spoon trying to avoid bottles with Roberta Coleman.  Mom has been wearing shells, but not consistently and says pre pumping seems to work better to help evert nipples.  Praised mom for her commitment to provide colostrum/breast milk for Roberta Coleman and previous baby.  Reviewed feeding cues and encouraged to put Roberta Coleman to the breast whenever she demonstrated feeding cues.  Reviewed newborn stomach size, supply and demand, normal course of lactation and routine newborn feeding patterns.  Lactation name and number written on white board and encouraged to call with any questions, concerns or assistance.    Maternal Data    Feeding Feeding  Type: Breast Fed  LATCH Score                   Interventions    Lactation Tools Discussed/Used     Consult Status      Jarold Motto 03/07/2019, 5:41 PM

## 2019-03-07 NOTE — Progress Notes (Signed)
Obstetric Postpartum/PostOperative Daily Progress Note Subjective:  33 y.o. [id number] post-operative day # 1 status post repeat cesarean delivery.  She is ambulating, is tolerating po, is voiding spontaneously.  Her pain is well controlled on PO pain medications. Her lochia is less than menses. She reports breastfeeding is going well.   Medications SCHEDULED MEDICATIONS  . ibuprofen  800 mg Oral Q8H  . prenatal multivitamin  1 tablet Oral Q1200  . senna-docusate  2 tablet Oral Q24H  . sertraline  50 mg Oral QHS  . simethicone  80 mg Oral TID PC  . simethicone  80 mg Oral Q24H    MEDICATION INFUSIONS    PRN MEDICATIONS  acetaminophen, coconut oil, witch hazel-glycerin **AND** dibucaine, diphenhydrAMINE **OR** diphenhydrAMINE, diphenhydrAMINE, menthol-cetylpyridinium, simethicone, [DISCONTINUED] naloxone **AND** sodium chloride flush    Objective:   Vitals:   03/07/19 0500 03/07/19 0700 03/07/19 0738 03/07/19 0800  BP:   129/70   Pulse:  100 100 95  Resp:   18   Temp:   98.2 F (36.8 C)   TempSrc:   Oral   SpO2: 98% 96% 96% 99%  Weight:      Height:        Current Vital Signs 24h Vital Sign Ranges  T 98.2 F (36.8 C) Temp  Avg: 98.3 F (36.8 C)  Min: 97.8 F (36.6 C)  Max: 98.6 F (37 C)  BP 129/70 BP  Min: 91/50  Max: 129/70  HR 95 Pulse  Avg: 97  Min: 81  Max: 110  RR 18 Resp  Avg: 17.7  Min: 16  Max: 18  SaO2 99 % (room air) SpO2  Avg: 97.7 %  Min: 96 %  Max: 100 %       24 Hour I/O Current Shift I/O  Time Ins Outs 08/27 0701 - 08/28 0700 In: 2658.1 [I.V.:2157.3] Out: 2040 [Urine:1075] 08/28 0701 - 08/28 1900 In: 480 [P.O.:480] Out: -   General: NAD Pulmonary: no increased work of breathing Abdomen: non-distended, non-tender, fundus firm at level of umbilicus Inc: Clean/dry/intact Extremities: no edema, no erythema, no tenderness  Labs:  Recent Labs  Lab 03/06/19 0542 03/07/19 0441  WBC 9.6 13.1*  HGB 13.4 11.6*  HCT 39.2 34.0*  PLT 181 140*      Assessment:   33 y.o. [id number] postoperative day # 1 status post repeat cesarean section  Plan:  1) Acute blood loss anemia - hemodynamically stable and asymptomatic - po ferrous sulfate  2) B POS / Rubella 5.06 (01/17 1457)/ Varicella Immune  3) TDAP status given antepartum  4) breast /Contraception = vasectomy  5) Disposition: continue routine post c/section care   Rod Can, CNM 03/07/2019 10:32 AM

## 2019-03-07 NOTE — Anesthesia Post-op Follow-up Note (Signed)
  Anesthesia Pain Follow-up Note  Patient: Roberta Coleman  Day #: 1  Date of Follow-up: 03/07/2019 Time: 7:26 AM  Last Vitals:  Vitals:   03/07/19 0300 03/07/19 0500  BP:    Pulse:    Resp:    Temp:    SpO2: 99% 98%    Level of Consciousness: alert  Pain: none   Side Effects:None  Catheter Site Exam:clean, dry, no drainage     Plan: D/C from anesthesia care at surgeon's request  Hedda Slade

## 2019-03-08 LAB — RPR: RPR Ser Ql: NONREACTIVE

## 2019-03-08 MED ORDER — HYDROMORPHONE HCL 2 MG PO TABS: 2.0000 mg | ORAL_TABLET | ORAL | 0 refills | Status: DC | PRN

## 2019-03-08 NOTE — Progress Notes (Signed)
Reviewed D/C instructions with pt and family. Pt verbalized understanding of teaching. Discharged to home via W/C. Pt to schedule f/u appt.  

## 2019-03-08 NOTE — Lactation Note (Signed)
This note was copied from a baby's chart. Lactation Consultation Note  Patient Name: Roberta Coleman Today's Date: 03/08/2019     Maternal Data    Feeding    LATCH Score                   Interventions    Lactation Tools Discussed/Used     Consult Status Consult Status: Complete LC discussed infant feeding cues with parents of pt. Clusterfeeding, I/O, signs of breastfeeding not going well, and how and where to receive help at D/C.  Patient thought that she had flat nipples, but LC saw that nipples are erect and that baby may have swallowed amniotic fluid post-delivery and may have been using the shield for coordinating suck.  LC was not able to evaluate a feeding because parents state that baby fed well at 920 for 76mins and had been previously clusterfeeding.   LC encourage parents to do skin to skin and monitor baby's feeding cues as well as length of time of nursing sessions once mature milk is established to help omit any concerns of possible oral restrictions.     Marnee Spring 03/08/2019, 11:57 AM

## 2019-03-10 ENCOUNTER — Other Ambulatory Visit: Payer: BC Managed Care – PPO

## 2019-03-14 ENCOUNTER — Other Ambulatory Visit: Payer: Self-pay

## 2019-03-14 ENCOUNTER — Encounter: Payer: Self-pay | Admitting: Obstetrics and Gynecology

## 2019-03-14 ENCOUNTER — Ambulatory Visit (INDEPENDENT_AMBULATORY_CARE_PROVIDER_SITE_OTHER): Payer: BC Managed Care – PPO | Admitting: Obstetrics and Gynecology

## 2019-03-14 VITALS — BP 110/70

## 2019-03-14 DIAGNOSIS — Z4889 Encounter for other specified surgical aftercare: Secondary | ICD-10-CM

## 2019-03-14 NOTE — Progress Notes (Signed)
      Postoperative Follow-up Patient presents post op from RLTCS 1weeks ago for prior C-section.  Subjective: Patient reports marked improvement in her preop symptoms. Eating a regular diet without difficulty. Pain is controlled without any medications.  Activity: normal activities of daily living.  Objective: Blood pressure 110/70, last menstrual period 06/06/2018, currently breastfeeding.  General: NAD Pulmonary: no increased work of breathing Abdomen: soft, non-tender, non-distended, incision D/C/I Extremities: no edema Neurologic: normal gait    Admission on 03/06/2019, Discharged on 03/08/2019  Component Date Value Ref Range Status  . ABO/RH(D) 03/06/2019 B POS   Final  . Antibody Screen 03/06/2019 NEG   Final  . Sample Expiration 03/06/2019    Final                   Value:03/09/2019,2359 Performed at [facility], [address] [place], [place] [address]   . WBC 03/06/2019 9.6  4.0 - 10.5 K/uL Final  . RBC 03/06/2019 4.38  3.87 - 5.11 MIL/uL Final  . Hemoglobin 03/06/2019 13.4  12.0 - 15.0 g/dL Final  . HCT 03/06/2019 39.2  36.0 - 46.0 % Final  . MCV 03/06/2019 89.5  80.0 - 100.0 fL Final  . MCH 03/06/2019 30.6  26.0 - 34.0 pg Final  . MCHC 03/06/2019 34.2  30.0 - 36.0 g/dL Final  . RDW 03/06/2019 14.6  11.5 - 15.5 % Final  . Platelets 03/06/2019 181  150 - 400 K/uL Final  . nRBC 03/06/2019 0.0  0.0 - 0.2 % Final   Performed at [facility], [address], [place], [place] [address]  . RPR Ser Ql 03/06/2019 NON REACTIVE  NON REACTIVE Final   Performed at [facility], [place] [address] [place], [place] [address]  . WBC 03/07/2019 13.1* 4.0 - 10.5 K/uL Final  . RBC 03/07/2019 3.70* 3.87 - 5.11 MIL/uL Final  . Hemoglobin 03/07/2019 11.6* 12.0 - 15.0 g/dL Final  . HCT 03/07/2019 34.0* 36.0 - 46.0 % Final  . MCV 03/07/2019 91.9  80.0 - 100.0 fL Final  . MCH 03/07/2019 31.4  26.0 - 34.0 pg Final  . MCHC 03/07/2019 34.1  30.0 - 36.0 g/dL  Final  . RDW 03/07/2019 14.8  11.5 - 15.5 % Final  . Platelets 03/07/2019 140* 150 - 400 K/uL Final  . nRBC 03/07/2019 0.0  0.0 - 0.2 % Final   Performed at [facility], [address] [place],  [address]    Assessment: 33 y.o. s/p RLTCS stable  Plan: Patient has done well after surgery with no apparent complications.  I have discussed the post-operative course to date, and the expected progress moving forward.  The patient understands what complications to be concerned about.  I will see the patient in routine follow up, or sooner if needed.    Activity plan: No lifting  Still considering vasectomy for contraception  Also bring up history of recurrent BV. ) Risk factors for bacterial vaginosis and candida infections discussed.  We discussed normal vaginal flora/microbiome.  Any factors that may alter the microbiome increase the risk of these opportunistic infections.  These include changes in pH, antibiotic exposures, diabetes, wet bathing suits etc.  We discussed that treatment is aimed at eradicating abnormal bacterial overgrowth and or yeast.  There may be some role for vaginal probiotics in restoring normal vaginal flora.      Malachy Mood, MD, [facility], [facility] 03/14/2019, 2:33 PM

## 2019-04-18 ENCOUNTER — Ambulatory Visit (INDEPENDENT_AMBULATORY_CARE_PROVIDER_SITE_OTHER): Payer: BC Managed Care – PPO | Admitting: Obstetrics and Gynecology

## 2019-04-18 ENCOUNTER — Encounter: Payer: Self-pay | Admitting: Obstetrics and Gynecology

## 2019-04-18 ENCOUNTER — Other Ambulatory Visit: Payer: Self-pay

## 2019-04-18 DIAGNOSIS — Z1389 Encounter for screening for other disorder: Secondary | ICD-10-CM | POA: Diagnosis not present

## 2019-04-18 DIAGNOSIS — N760 Acute vaginitis: Secondary | ICD-10-CM

## 2019-04-18 DIAGNOSIS — B9689 Other specified bacterial agents as the cause of diseases classified elsewhere: Secondary | ICD-10-CM

## 2019-04-18 MED ORDER — METRONIDAZOLE 0.75 % VA GEL: 1.0000 | Freq: Every day | VAGINAL | 1 refills | Status: AC

## 2019-04-18 NOTE — Progress Notes (Signed)
Postpartum Visit  Chief Complaint:  Chief Complaint  Patient presents with  . Postpartum Care    C/S 8/27    History of Present Illness: Patient is a 33 y.o. [id number] presents for postpartum visit.  Date of delivery: 03/06/2019 Cesarean Section: Elective repeat Pregnancy or labor problems:  no Any problems since the delivery:  no  Newborn Details:  SINGLETON :  1. BabyGender female. Birth weight: 7lbs 14oz Maternal Details:  Breast or formula feeding: plans to bottle feed Intercourse: No  Contraception after delivery: Yes  Any bowel or bladder issues: No  Post partum depression/anxiety noted:  no Edinburgh Post-Partum Depression Score:4 Date of last PAP: 04/04/2018  NIL and HR HPV negative   Review of Systems: Review of Systems  Constitutional: Negative.   Gastrointestinal: Negative.   Genitourinary: Negative.     The following portions of the patient's history were reviewed and updated as appropriate: allergies, current medications, past family history, past medical history, past social history, past surgical history and problem list.  Past Medical History:  Past Medical History:  Diagnosis Date  . Allergy   . High cholesterol   . History of blood transfusion 2017   symptomatic anemia after incomplete AB/ D&C  . Hyperlipidemia   . Obesity     Past Surgical History:  Past Surgical History:  Procedure Laterality Date  . CESAREAN SECTION    . CESAREAN SECTION N/A 03/06/2019   Procedure: CESAREAN SECTION;  Surgeon: Vena Austria, MD;  Location: [facility] ORS;  Service: Obstetrics;  Laterality: N/A;  . DILATION AND EVACUATION N/A 01/21/2016   Procedure: Suction D & C;  Surgeon: Vena Austria, MD;  Location: [facility] ORS;  Service: Gynecology;  Laterality: N/A;  . EYE SURGERY  2010   LASIX  . OTHER SURGICAL HISTORY     Teacher, English as a foreign language  . WISDOM TOOTH EXTRACTION      Family History:  Family History  Problem Relation Age of Onset  . Hypertension Mother    . Hyperlipidemia Father     Social History:  Social History   Socioeconomic History  . Marital status: Married    Spouse name: Not on file  . Number of children: Not on file  . Years of education: Not on file  . Highest education level: Not on file  Occupational History  . Occupation: Prime Stage manager: Archivist  Social Needs  . Financial resource strain: Not on file  . Food insecurity    Worry: Not on file    Inability: Not on file  . Transportation needs    Medical: Not on file    Non-medical: Not on file  Tobacco Use  . Smoking status: Never Smoker  . Smokeless tobacco: Never Used  Substance and Sexual Activity  . Alcohol use: Not Currently    Comment: occasionally  . Drug use: Never  . Sexual activity: Yes    Partners: Male    Birth control/protection: None  Lifestyle  . Physical activity    Days per week: Not on file    Minutes per session: Not on file  . Stress: Not on file  Relationships  . Social Musician on phone: Not on file    Gets together: Not on file    Attends religious service: Not on file    Active member of club or organization: Not on file    Attends meetings of clubs or organizations: Not on file  Relationship status: Not on file  . Intimate partner violence    Fear of current or ex partner: Not on file    Emotionally abused: Not on file    Physically abused: Not on file    Forced sexual activity: Not on file  Other Topics Concern  . Not on file  Social History Narrative   Interval running-walks the dog          Allergies:  Allergies  Allergen Reactions  . Oxycodone Hcl Itching    Medications: Prior to Admission medications   Medication Sig Start Date End Date Taking? Authorizing Provider  sertraline (ZOLOFT) 50 MG tablet TAKE 1 TABLET BY MOUTH EVERY DAY Patient taking differently: Take 50 mg by mouth at bedtime.  08/26/18  Yes Rexene Agent, CNM    Physical Exam Blood pressure 106/62,  height 5\' 4"  (1.626 m), weight 183 lb (83 kg), last menstrual period 06/06/2018, currently breastfeeding.  General: NAD HEENT: normocephalic, anicteric Pulmonary: No increased work of breathing Abdomen: NABS, soft, non-tender, non-distended.  Umbilicus without lesions.  No hepatomegaly, splenomegaly or masses palpable. No evidence of hernia. Incision well healed Genitourinary:  External: Normal external female genitalia.  Normal urethral meatus, normal  Bartholin's and Skene's glands.    Vagina: Normal vaginal mucosa, no evidence of prolapse.    Cervix: Grossly normal in appearance, no bleeding  Uterus: Non-enlarged, mobile, normal contour.  No CMT  Adnexa: ovaries non-enlarged, no adnexal masses  Rectal: deferred Extremities: no edema, erythema, or tenderness Neurologic: Grossly intact Psychiatric: mood appropriate, affect full    Assessment: 33 y.o. [id number] presenting for 6 week postpartum visit  Plan: Problem List Items Addressed This Visit    None    Visit Diagnoses    6 weeks postpartum follow-up    -  Primary   Bacterial vaginosis          1) Contraception - Education given regarding options for contraception, as well as compatibility with breast feeding if applicable.  Patient plans on vasectomy for contraception.  2)  Pap - ASCCP guidelines and rational discussed.  ASCCP guidelines and rational discussed.  Patient opts for every 3 years screening interval  3) Patient underwent screening for postpartum depression with no signs of depression  4) Vaginitis - nuswab rx metrogel  5) Return in about 1 year (around 04/17/2020) for annual.   Malachy Mood, MD, [place], [facility] 04/18/2019, 3:23 PM

## 2019-04-22 LAB — NUSWAB BV AND CANDIDA, NAA
Candida albicans, NAA: NEGATIVE
Candida glabrata, NAA: NEGATIVE

## 2019-08-19 IMAGING — CT CT ANGIO CHEST
1 of 6 series · 19 of 36 positions shown · IV contrast (APPLIED)
Comparison: None.

CLINICAL DATA: 32-year-old with left pleuritic chest pain. Elevated
D-dimer.

EXAM:
CT ANGIOGRAPHY CHEST WITH CONTRAST
TECHNIQUE: Multidetector CT imaging of the chest was performed using the
standard protocol during bolus administration of intravenous
contrast. Multiplanar CT image reconstructions and MIPs were
obtained to evaluate the vascular anatomy.
CONTRAST:  100mL H4XMAG-V33 IOPAMIDOL (H4XMAG-V33) INJECTION 76%

[Series 5: thins · axial · 0.71mm/px · z∈[-113,+84]mm · 19 of 219 slices shown]
[im 11/219  lung]
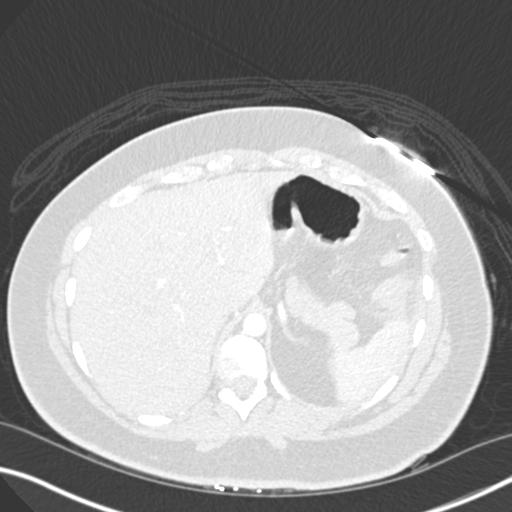
[im 22/219  mediastinal]
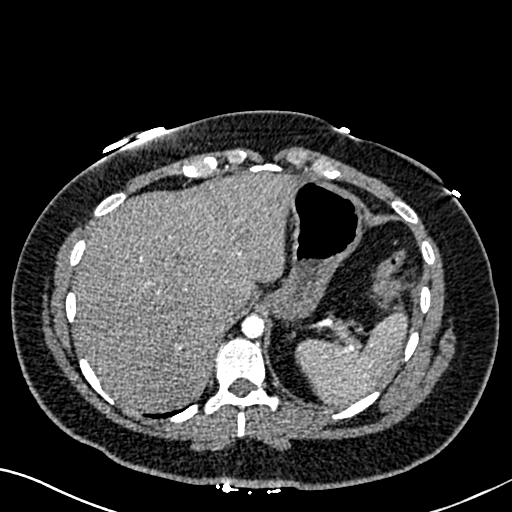
[im 33/219  lung]
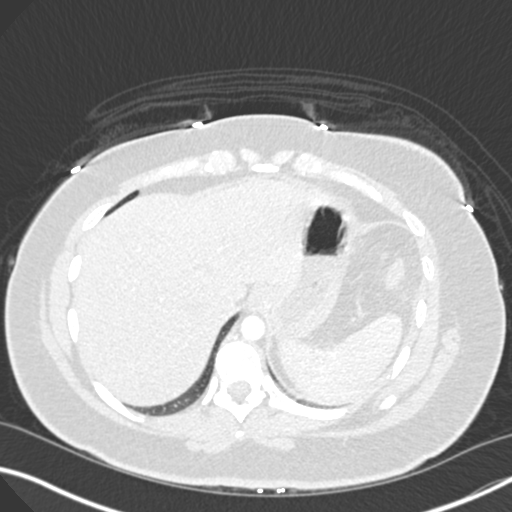
[im 44/219  mediastinal]
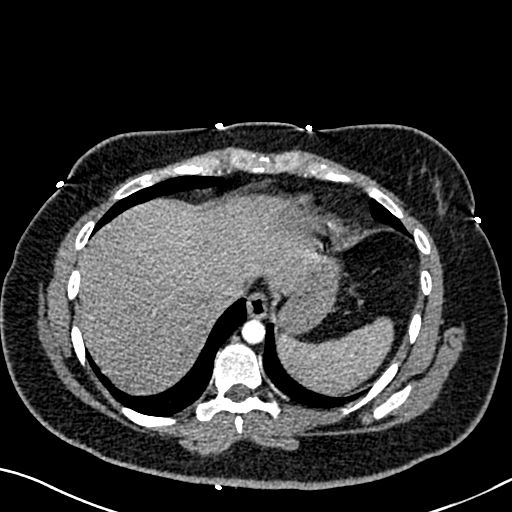
[im 55/219  lung]
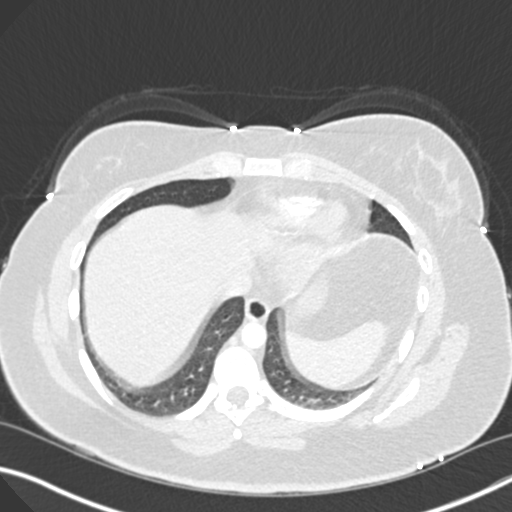
[im 66/219  mediastinal]
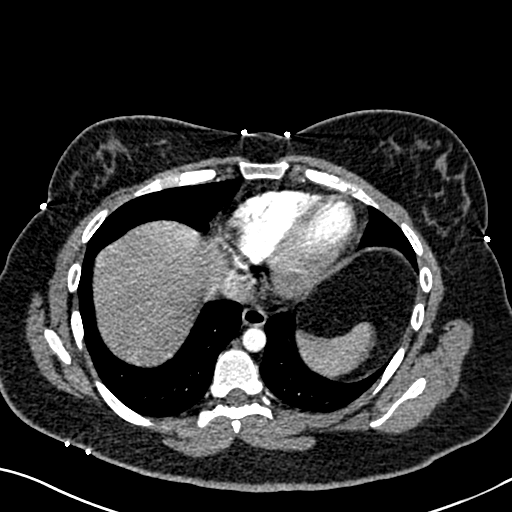
[im 77/219  lung]
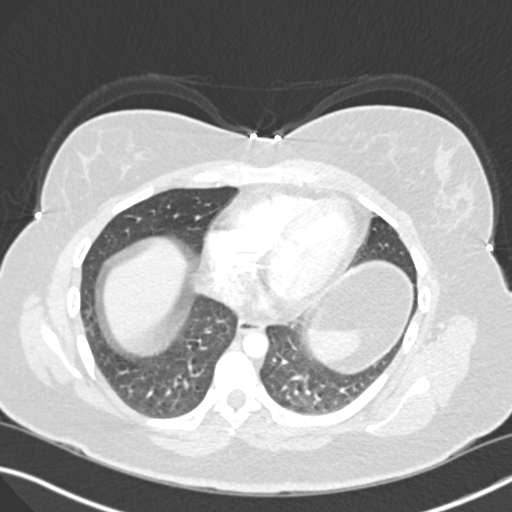
[im 88/219  mediastinal]
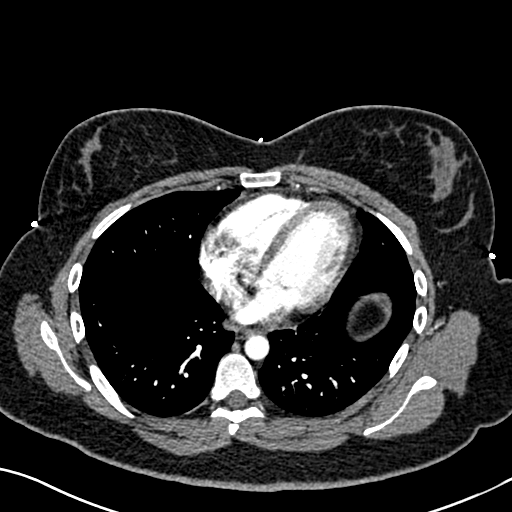
[im 99/219  lung]
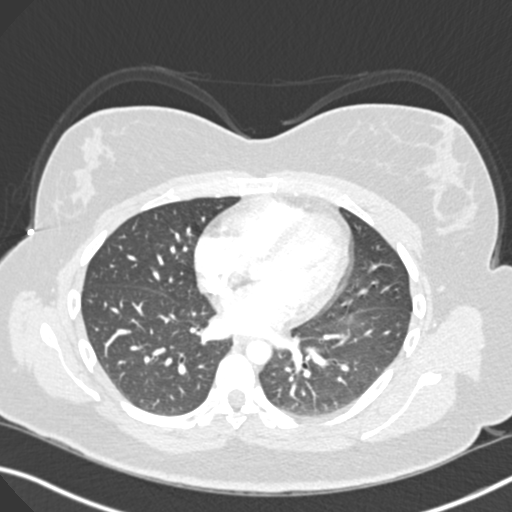
[im 110/219  mediastinal]
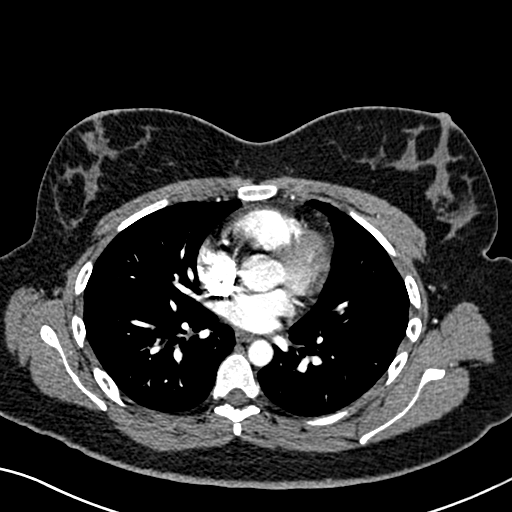
[im 120/219  lung]
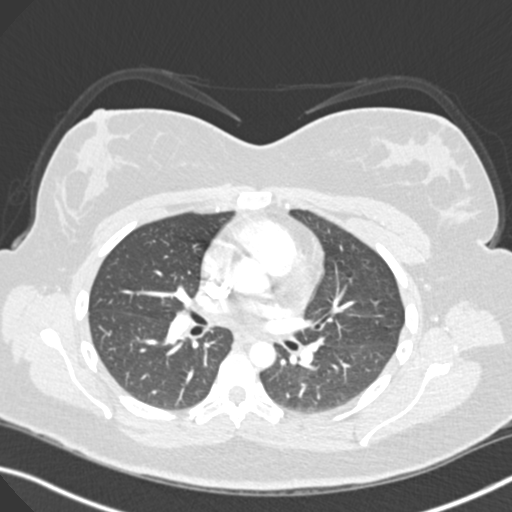
[im 131/219  mediastinal]
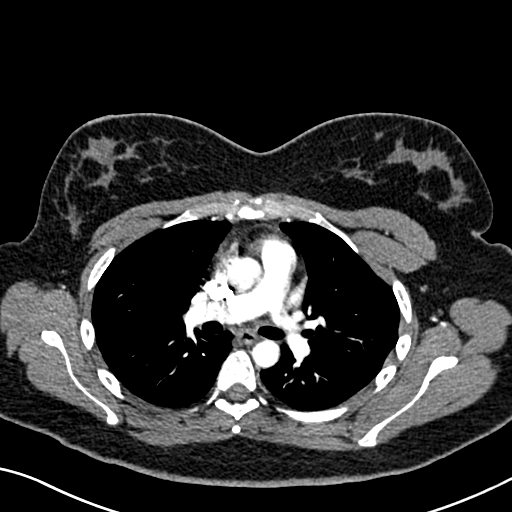
[im 142/219  lung]
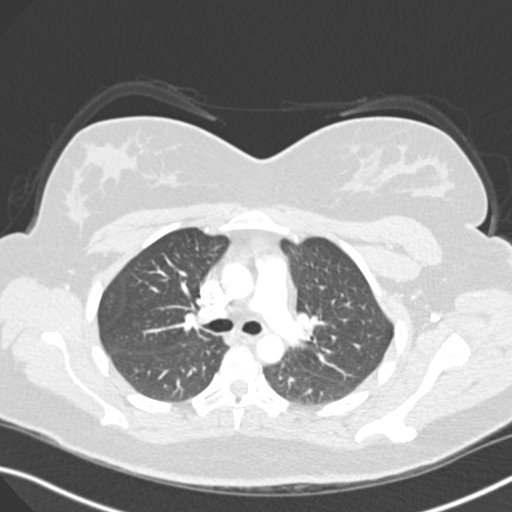
[im 153/219  mediastinal]
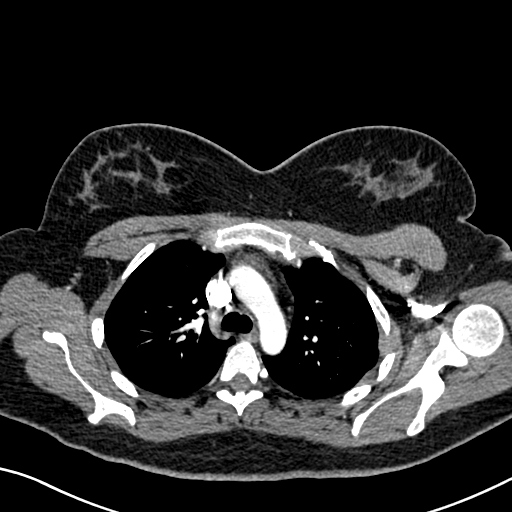
[im 164/219  lung]
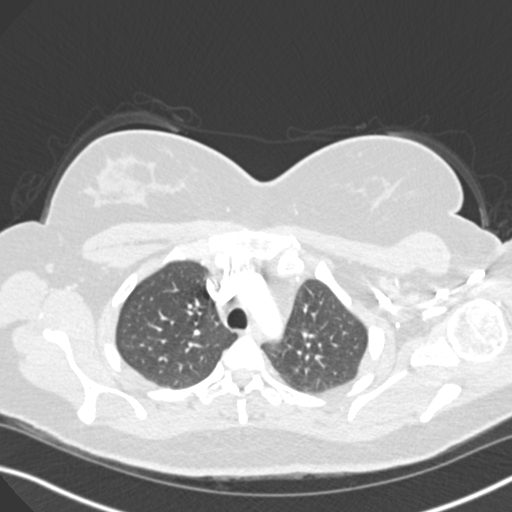
[im 175/219  mediastinal]
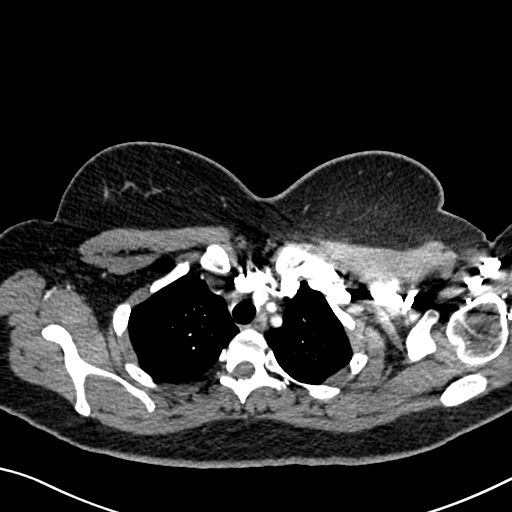
[im 186/219  lung]
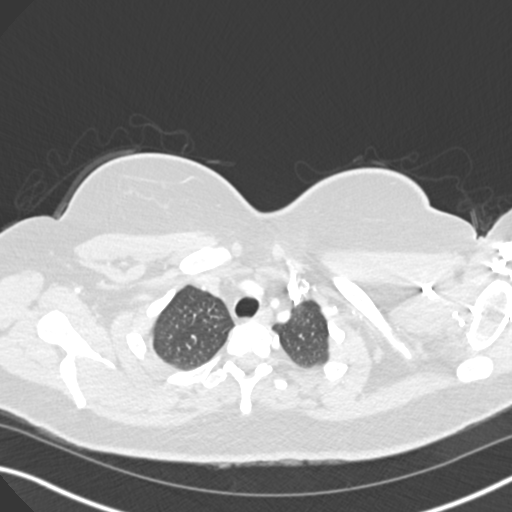
[im 197/219  mediastinal]
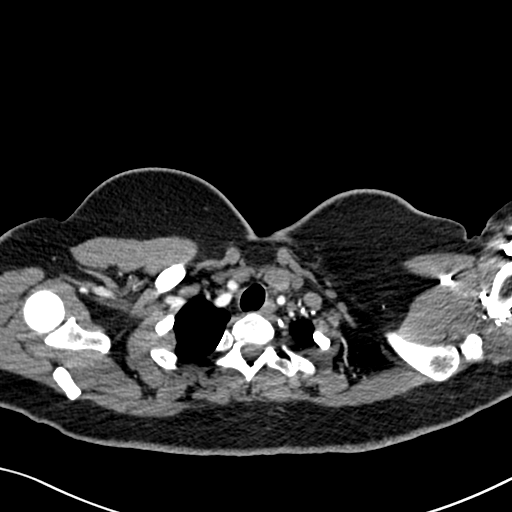
[im 208/219  lung]
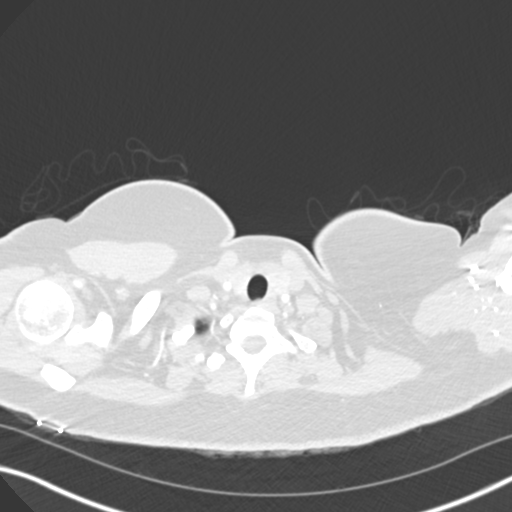

[19 of 36 positions shown; findings below may reference images not displayed]

FINDINGS: Cardiovascular: Pulmonary arteries and thoracic aorta are opacified
on this examination. Normal appearance of the thoracic aorta and
great vessels. No evidence for aortic dissection. Main and central
pulmonary arteries are patent. No evidence for filling defects or
pulmonary emboli involving the lobar or segmental pulmonary artery
branches.

Mediastinum/Nodes: No mediastinal or hilar lymphadenopathy. No
axillary lymph node enlargement. Esophagus is unremarkable.

Lungs/Pleura: Trachea and mainstem bronchi are patent. No pleural
effusions. Bilateral lungs are clear.

Upper Abdomen: There is stranding and edema around the splenic
flexure of the colon. This area of inflammation is not completely
visualized.

Musculoskeletal: No chest wall abnormality. No acute or significant
osseous findings.

Review of the MIP images confirms the above findings.
IMPRESSION: Inflammatory changes involving the splenic flexure of the colon.
This area is incompletely evaluated but likely the source for the
patient's symptoms. This could be further characterized with a
dedicated CT of the abdomen and pelvis.

No acute chest abnormality. Specifically, no evidence for pulmonary
embolism.

## 2019-08-24 ENCOUNTER — Other Ambulatory Visit: Payer: Self-pay | Admitting: Obstetrics and Gynecology

## 2019-09-17 ENCOUNTER — Other Ambulatory Visit: Payer: Self-pay | Admitting: Obstetrics and Gynecology

## 2019-11-20 ENCOUNTER — Other Ambulatory Visit: Payer: Self-pay | Admitting: Obstetrics and Gynecology

## 2019-11-20 MED ORDER — LO LOESTRIN FE 1 MG-10 MCG / 10 MCG PO TABS: 1.0000 | ORAL_TABLET | Freq: Every day | ORAL | 3 refills | Status: DC

## 2019-11-24 DIAGNOSIS — R21 Rash and other nonspecific skin eruption: Secondary | ICD-10-CM | POA: Diagnosis not present

## 2019-11-25 ENCOUNTER — Other Ambulatory Visit: Payer: Self-pay | Admitting: Obstetrics and Gynecology

## 2019-11-25 MED ORDER — NORETHIN ACE-ETH ESTRAD-FE 1-20 MG-MCG PO TABS: 1.0000 | ORAL_TABLET | Freq: Every day | ORAL | 3 refills | Status: DC

## 2019-12-03 ENCOUNTER — Other Ambulatory Visit: Payer: Self-pay | Admitting: Obstetrics and Gynecology

## 2019-12-03 MED ORDER — NORETHIN ACE-ETH ESTRAD-FE 1-20 MG-MCG PO TABS: 1.0000 | ORAL_TABLET | Freq: Every day | ORAL | 3 refills | Status: DC

## 2020-03-22 ENCOUNTER — Ambulatory Visit: Payer: BC Managed Care – PPO | Admitting: Obstetrics and Gynecology

## 2020-09-10 DIAGNOSIS — F4323 Adjustment disorder with mixed anxiety and depressed mood: Secondary | ICD-10-CM | POA: Diagnosis not present

## 2020-09-21 DIAGNOSIS — Z0189 Encounter for other specified special examinations: Secondary | ICD-10-CM | POA: Diagnosis not present

## 2020-09-23 DIAGNOSIS — Z043 Encounter for examination and observation following other accident: Secondary | ICD-10-CM | POA: Diagnosis not present

## 2020-09-23 DIAGNOSIS — E785 Hyperlipidemia, unspecified: Secondary | ICD-10-CM | POA: Diagnosis not present

## 2020-09-23 DIAGNOSIS — Z713 Dietary counseling and surveillance: Secondary | ICD-10-CM | POA: Diagnosis not present

## 2020-09-24 DIAGNOSIS — F4323 Adjustment disorder with mixed anxiety and depressed mood: Secondary | ICD-10-CM | POA: Diagnosis not present

## 2020-10-08 DIAGNOSIS — R112 Nausea with vomiting, unspecified: Secondary | ICD-10-CM | POA: Diagnosis not present

## 2020-10-15 DIAGNOSIS — F4323 Adjustment disorder with mixed anxiety and depressed mood: Secondary | ICD-10-CM | POA: Diagnosis not present

## 2020-11-05 DIAGNOSIS — F4323 Adjustment disorder with mixed anxiety and depressed mood: Secondary | ICD-10-CM | POA: Diagnosis not present

## 2020-11-13 ENCOUNTER — Other Ambulatory Visit: Payer: Self-pay | Admitting: Obstetrics and Gynecology

## 2021-01-03 DIAGNOSIS — Z1152 Encounter for screening for COVID-19: Secondary | ICD-10-CM | POA: Diagnosis not present

## 2021-01-07 HISTORY — PX: OTHER SURGICAL HISTORY: SHX169

## 2021-03-28 DIAGNOSIS — Z0189 Encounter for other specified special examinations: Secondary | ICD-10-CM | POA: Diagnosis not present

## 2021-08-04 ENCOUNTER — Other Ambulatory Visit: Payer: Self-pay | Admitting: Obstetrics and Gynecology

## 2021-09-07 NOTE — Progress Notes (Signed)
? ?PCP:  [facility] ? ? ?Chief Complaint  ?Patient presents with  ? Gynecologic Exam  ?  fatigue  ? ? ? ?HPI: ?     Roberta Coleman is a 36 y.o. P7T0626 whose LMP was No LMP recorded. (Menstrual status: Oral contraceptives)., presents today for her annual examination.  Her menses are absent with cont dosing of OCPs, never takes placebo pills. No BTB, no dysmen.  ? ?Sex activity: single partner, contraception - OCP (estrogen/progesterone). No pain/bleeding.  ?Last Pap: 04/04/18 Results were: no abnormalities /neg HPV DNA; no hx of abn paps.  ? ?There is no FH of breast cancer. There is no FH of ovarian cancer. The patient does not do self-breast exams. ? ?Tobacco use: The patient denies current or previous tobacco use. ?Alcohol use: social drinker ?No drug use.  ?Exercise: moderately active ? ?She does get adequate calcium and Vitamin D in her diet. ?Labs with wellness clinic through husband's work. Pt is s/p bariatric surgery, taking supp. Has plateaued on wt loss, has fatigue. Sleeps 8 hrs nightly, is exercising. Drinks unsweetened tea, little water. Still feels hungry.  ? ?Patient Active Problem List  ? Diagnosis Date Noted  ? Uterine scar from previous cesarean delivery affecting pregnancy 03/06/2019  ? Anxiety 08/29/2018  ? E. coli UTI 07/31/2018  ? Supervision of other normal pregnancy, antepartum 07/30/2018  ? History of cesarean delivery, currently pregnant 07/26/2018  ? HYPERLIPIDEMIA 06/10/2010  ? OBESITY 03/23/2008  ? ? ?Past Surgical History:  ?Procedure Laterality Date  ? CESAREAN SECTION    ? CESAREAN SECTION N/A 03/06/2019  ? Procedure: CESAREAN SECTION;  Surgeon: Vena Austria, MD;  Location: [facility] ORS;  Service: Obstetrics;  Laterality: N/A;  ? DILATION AND EVACUATION N/A 01/21/2016  ? Procedure: Suction D & C;  Surgeon: Vena Austria, MD;  Location: [facility] ORS;  Service: Gynecology;  Laterality: N/A;  ? EYE SURGERY  2010  ? LASIX  ? OTHER SURGICAL HISTORY    ? [place] Butt  Lift  ? WISDOM TOOTH EXTRACTION    ? ? ?Family History  ?Problem Relation Age of Onset  ? Hypertension Mother   ? Hyperlipidemia Father   ? ? ?Social History  ? ?Socioeconomic History  ? Marital status: Married  ?  Spouse name: Not on file  ? Number of children: Not on file  ? Years of education: Not on file  ? Highest education level: Not on file  ?Occupational History  ? Occupation: Prime Psychologist, forensic  ?  Employer: [employer]  ?Tobacco Use  ? Smoking status: Never  ? Smokeless tobacco: Never  ?Vaping Use  ? Vaping Use: Former  ?Substance and Sexual Activity  ? Alcohol use: Not Currently  ?  Comment: occasionally  ? Drug use: Never  ? Sexual activity: Yes  ?  Partners: Male  ?  Birth control/protection: Pill  ?Other Topics Concern  ? Not on file  ?Social History Narrative  ? Interval running-walks the dog  ?   ?   ? ?Social Determinants of Health  ? ?Financial Resource Strain: Not on file  ?Food Insecurity: Not on file  ?Transportation Needs: Not on file  ?Physical Activity: Not on file  ?Stress: Not on file  ?Social Connections: Not on file  ?Intimate Partner Violence: Not on file  ? ? ? ?Current Outpatient Medications:  ?  atorvastatin (LIPITOR) 10 MG tablet, Take 10 mg by mouth at bedtime., Disp: , Rfl:  ?  sertraline (ZOLOFT) 50 MG  tablet, Take 1 tablet (50 mg total) by mouth at bedtime., Disp: 30 tablet, Rfl: 1 ?  norethindrone-ethinyl estradiol-FE (JUNEL FE 1/20) 1-20 MG-MCG tablet, TAKE 1 TABLET BY MOUTH DAILY CONTINUOUS DOSING, Disp: 84 tablet, Rfl: 4 ? ? ? ? ?ROS: ? ?Review of Systems  ?Constitutional:  Positive for fatigue. Negative for fever and unexpected weight change.  ?Respiratory:  Negative for cough, shortness of breath and wheezing.   ?Cardiovascular:  Negative for chest pain, palpitations and leg swelling.  ?Gastrointestinal:  Negative for blood in stool, constipation, diarrhea, nausea and vomiting.  ?Endocrine: Negative for cold intolerance, heat intolerance and polyuria.  ?Genitourinary:   Negative for dyspareunia, dysuria, flank pain, frequency, genital sores, hematuria, menstrual problem, pelvic pain, urgency, vaginal bleeding, vaginal discharge and vaginal pain.  ?Musculoskeletal:  Negative for back pain, joint swelling and myalgias.  ?Skin:  Negative for rash.  ?Neurological:  Negative for dizziness, syncope, light-headedness, numbness and headaches.  ?Hematological:  Negative for adenopathy.  ?Psychiatric/Behavioral:  Negative for agitation, confusion, sleep disturbance and suicidal ideas. The patient is not nervous/anxious.   ?BREAST: No symptoms ? ? ?Objective: ?BP 100/60   Ht 5\' 3"  (1.6 m)   Wt 161 lb (73 kg)   Breastfeeding No   BMI 28.52 kg/m?  ? ? ?Physical Exam ?Constitutional:   ?   Appearance: She is well-developed.  ?Genitourinary:  ?   Vulva normal.  ?   Right Labia: No rash, tenderness or lesions. ?   Left Labia: No tenderness, lesions or rash. ?   No vaginal discharge, erythema or tenderness.  ? ?   Right Adnexa: not tender and no mass present. ?   Left Adnexa: not tender and no mass present. ?   No cervical friability or polyp.  ?   Uterus is not enlarged or tender.  ?Breasts: ?   Right: No mass, nipple discharge, skin change or tenderness.  ?   Left: No mass, nipple discharge, skin change or tenderness.  ?Neck:  ?   Thyroid: No thyromegaly.  ?Cardiovascular:  ?   Rate and Rhythm: Normal rate and regular rhythm.  ?   Heart sounds: Normal heart sounds. No murmur heard. ?Pulmonary:  ?   Effort: Pulmonary effort is normal.  ?   Breath sounds: Normal breath sounds.  ?Abdominal:  ?   Palpations: Abdomen is soft.  ?   Tenderness: There is no abdominal tenderness. There is no guarding or rebound.  ?Musculoskeletal:     ?   General: Normal range of motion.  ?   Cervical back: Normal range of motion.  ?Lymphadenopathy:  ?   Cervical: No cervical adenopathy.  ?Neurological:  ?   General: No focal deficit present.  ?   Mental Status: She is alert and oriented to person, place, and time.  ?    Cranial Nerves: No cranial nerve deficit.  ?Skin: ?   General: Skin is warm and dry.  ?Psychiatric:     ?   Mood and Affect: Mood normal.     ?   Behavior: Behavior normal.     ?   Thought Content: Thought content normal.     ?   Judgment: Judgment normal.  ?Vitals reviewed.  ? ? ?Assessment/Plan: ?Encounter for annual routine gynecological examination ? ?Encounter for surveillance of contraceptive pills - Plan: norethindrone-ethinyl estradiol-FE (JUNEL FE 1/20) 1-20 MG-MCG tablet; Rx RF to mail order. ? ?Fatigue--may need more sleep, increase water for hydration. F/u with bariatric surg re: nutritional support, other  labs needing to be checked.  ? ?Meds ordered this encounter  ?Medications  ? norethindrone-ethinyl estradiol-FE (JUNEL FE 1/20) 1-20 MG-MCG tablet  ?  Sig: TAKE 1 TABLET BY MOUTH DAILY CONTINUOUS DOSING  ?  Dispense:  84 tablet  ?  Refill:  4  ?  Order Specific Question:   Supervising Provider  ?  AnswerNadara Mustard [[id number]]  ? ?          ?GYN counsel adequate intake of calcium and vitamin D, diet and exercise ? ? ?  F/U ? Return in about 1 year (around 09/09/2022). ? ?Macoy Rodwell B. Cassidie Veiga, PA-C ?09/08/2021 ?10:41 AM ?

## 2021-09-08 ENCOUNTER — Encounter: Payer: Self-pay | Admitting: Obstetrics and Gynecology

## 2021-09-08 ENCOUNTER — Ambulatory Visit (INDEPENDENT_AMBULATORY_CARE_PROVIDER_SITE_OTHER): Payer: BC Managed Care – PPO | Admitting: Obstetrics and Gynecology

## 2021-09-08 ENCOUNTER — Other Ambulatory Visit: Payer: Self-pay

## 2021-09-08 VITALS — BP 100/60 | Ht 63.0 in | Wt 161.0 lb

## 2021-09-08 DIAGNOSIS — Z3041 Encounter for surveillance of contraceptive pills: Secondary | ICD-10-CM

## 2021-09-08 DIAGNOSIS — Z01419 Encounter for gynecological examination (general) (routine) without abnormal findings: Secondary | ICD-10-CM | POA: Diagnosis not present

## 2021-09-08 MED ORDER — NORETHIN ACE-ETH ESTRAD-FE 1-20 MG-MCG PO TABS: ORAL_TABLET | ORAL | 4 refills | Status: DC

## 2021-09-08 NOTE — Patient Instructions (Signed)
I value your feedback and you entrusting us with your care. If you get a [facility] patient survey, I would appreciate you taking the time to let us know about your experience today. Thank you! ? ? ?

## 2021-09-27 DIAGNOSIS — Z0189 Encounter for other specified special examinations: Secondary | ICD-10-CM | POA: Diagnosis not present

## 2021-09-30 DIAGNOSIS — Z713 Dietary counseling and surveillance: Secondary | ICD-10-CM | POA: Diagnosis not present

## 2021-09-30 DIAGNOSIS — Z043 Encounter for examination and observation following other accident: Secondary | ICD-10-CM | POA: Diagnosis not present

## 2021-09-30 DIAGNOSIS — E785 Hyperlipidemia, unspecified: Secondary | ICD-10-CM | POA: Diagnosis not present

## 2021-12-19 DIAGNOSIS — F40243 Fear of flying: Secondary | ICD-10-CM | POA: Diagnosis not present

## 2021-12-19 DIAGNOSIS — T753XXA Motion sickness, initial encounter: Secondary | ICD-10-CM | POA: Diagnosis not present

## 2022-06-28 ENCOUNTER — Other Ambulatory Visit: Payer: Self-pay | Admitting: Obstetrics and Gynecology

## 2022-06-28 DIAGNOSIS — Z3041 Encounter for surveillance of contraceptive pills: Secondary | ICD-10-CM

## 2022-08-17 ENCOUNTER — Other Ambulatory Visit: Payer: Self-pay

## 2022-08-17 ENCOUNTER — Encounter: Payer: Self-pay | Admitting: Obstetrics and Gynecology

## 2022-08-17 DIAGNOSIS — Z3041 Encounter for surveillance of contraceptive pills: Secondary | ICD-10-CM

## 2022-08-17 MED ORDER — NORETHIN ACE-ETH ESTRAD-FE 1-20 MG-MCG PO TABS: ORAL_TABLET | ORAL | 4 refills | Status: DC

## 2022-09-25 NOTE — Progress Notes (Unsigned)
PCP:  [facility]   No chief complaint on file.    HPI:      [name] is a 37 y.o. [id number] whose LMP was No LMP recorded. (Menstrual status: Oral contraceptives)., presents today for her annual examination.  Her menses are absent with cont dosing of OCPs, never takes placebo pills. No BTB, no dysmen.   Sex activity: single partner, contraception - OCP (estrogen/progesterone). No pain/bleeding.  Last Pap: 04/04/18 Results were: no abnormalities /neg HPV DNA; no hx of abn paps.   There is no FH of breast cancer. There is no FH of ovarian cancer. The patient does not do self-breast exams.  Tobacco use: The patient denies current or previous tobacco use. Alcohol use: social drinker No drug use.  Exercise: moderately active  She does get adequate calcium and Vitamin D in her diet. Labs with wellness clinic through husband's work. Pt is s/p bariatric surgery, taking supp. Has plateaued on wt loss, has fatigue. Sleeps 8 hrs nightly, is exercising. Drinks unsweetened tea, little water. Still feels hungry.   Patient Active Problem List   Diagnosis Date Noted   Uterine scar from previous cesarean delivery affecting pregnancy 03/06/2019   Anxiety 08/29/2018   E. coli UTI 07/31/2018   Supervision of other normal pregnancy, antepartum 07/30/2018   History of cesarean delivery, currently pregnant 07/26/2018   HYPERLIPIDEMIA 06/10/2010   OBESITY 03/23/2008    Past Surgical History:  Procedure Laterality Date   CESAREAN SECTION     CESAREAN SECTION N/A 03/06/2019   Procedure: CESAREAN SECTION;  Surgeon: Malachy Mood, MD;  Location: [facility] ORS;  Service: Obstetrics;  Laterality: N/A;   DILATION AND EVACUATION N/A 01/21/2016   Procedure: Suction D & C;  Surgeon: Malachy Mood, MD;  Location: [facility] OR[facility];  Service: Gynecology;  Laterality: N/A;   EYE SURGERY  2010   LASIX   OTHER SURGICAL HISTORY     [place]   WISDOM TOOTH EXTRACTION       Family History  Problem Relation Age of Onset   Hypertension Mother    Hyperlipidemia Father     Social History   Socioeconomic History   Marital status: Married    Spouse name: Not on file   Number of children: Not on file   Years of education: Not on file   Highest education level: Not on file  Occupational History   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  Tobacco Use   Smoking status: Never   Smokeless tobacco: Never  Vaping Use   Vaping Use: Former  Substance and Sexual Activity   Alcohol use: Not Currently    Comment: occasionally   Drug use: Never   Sexual activity: Yes    Partners: Male    Birth control/protection: Pill  Other Topics Concern   Not on file  Social History Narrative   Interval running-walks the dog         Social Determinants of Health   Financial Resource Strain: Not on file  Food Insecurity: Not on file  Transportation Needs: Not on file  Physical Activity: Not on file  Stress: Not on file  Social Connections: Not on file  Intimate Partner Violence: Not on file     Current Outpatient Medications:    atorvastatin (LIPITOR) 10 MG tablet, Take 10 mg by mouth at bedtime., Disp: , Rfl:    norethindrone-ethinyl estradiol-FE (JUNEL FE 1/20) 1-20 MG-MCG tablet, TAKE 1 TABLET BY MOUTH DAILY  CONTINUOUS DOSING, Disp: 84 tablet, Rfl: 4   sertraline (ZOLOFT) 50 MG tablet, Take 1 tablet (50 mg total) by mouth at bedtime., Disp: 30 tablet, Rfl: 1     ROS:  Review of Systems  Constitutional:  Positive for fatigue. Negative for fever and unexpected weight change.  Respiratory:  Negative for cough, shortness of breath and wheezing.   Cardiovascular:  Negative for chest pain, palpitations and leg swelling.  Gastrointestinal:  Negative for blood in stool, constipation, diarrhea, nausea and vomiting.  Endocrine: Negative for cold intolerance, heat intolerance and polyuria.  Genitourinary:  Negative for dyspareunia, dysuria,  flank pain, frequency, genital sores, hematuria, menstrual problem, pelvic pain, urgency, vaginal bleeding, vaginal discharge and vaginal pain.  Musculoskeletal:  Negative for back pain, joint swelling and myalgias.  Skin:  Negative for rash.  Neurological:  Negative for dizziness, syncope, light-headedness, numbness and headaches.  Hematological:  Negative for adenopathy.  Psychiatric/Behavioral:  Negative for agitation, confusion, sleep disturbance and suicidal ideas. The patient is not nervous/anxious.    BREAST: No symptoms   Objective: There were no vitals taken for this visit.   Physical Exam Constitutional:      Appearance: She is well-developed.  Genitourinary:     Vulva normal.     Right Labia: No rash, tenderness or lesions.    Left Labia: No tenderness, lesions or rash.    No vaginal discharge, erythema or tenderness.      Right Adnexa: not tender and no mass present.    Left Adnexa: not tender and no mass present.    No cervical friability or polyp.     Uterus is not enlarged or tender.  Breasts:    Right: No mass, nipple discharge, skin change or tenderness.     Left: No mass, nipple discharge, skin change or tenderness.  Neck:     Thyroid: No thyromegaly.  Cardiovascular:     Rate and Rhythm: Normal rate and regular rhythm.     Heart sounds: Normal heart sounds. No murmur heard. Pulmonary:     Effort: Pulmonary effort is normal.     Breath sounds: Normal breath sounds.  Abdominal:     Palpations: Abdomen is soft.     Tenderness: There is no abdominal tenderness. There is no guarding or rebound.  Musculoskeletal:        General: Normal range of motion.     Cervical back: Normal range of motion.  Lymphadenopathy:     Cervical: No cervical adenopathy.  Neurological:     General: No focal deficit present.     Mental Status: She is alert and oriented to person, place, and time.     Cranial Nerves: No cranial nerve deficit.  Skin:    General: Skin is warm  and dry.  Psychiatric:        Mood and Affect: Mood normal.        Behavior: Behavior normal.        Thought Content: Thought content normal.        Judgment: Judgment normal.  Vitals reviewed.     Assessment/Plan: Encounter for annual routine gynecological examination  Encounter for surveillance of contraceptive pills - Plan: norethindrone-ethinyl estradiol-FE (JUNEL FE 1/20) 1-20 MG-MCG tablet; Rx RF to mail order.  Fatigue--may need more sleep, increase water for hydration. F/u with bariatric surg re: nutritional support, other labs needing to be checked.   No orders of the defined types were placed in this encounter.  GYN counsel adequate intake of calcium and vitamin D, diet and exercise     F/U  No follow-ups on file.  Tamorah Hada B. Betzalel Umbarger, PA-C 09/25/2022 5:15 PM

## 2022-09-26 ENCOUNTER — Other Ambulatory Visit (HOSPITAL_COMMUNITY)
Admission: RE | Admit: 2022-09-26 | Discharge: 2022-09-26 | Disposition: A | Discharge status: Home / Self Care | Payer: BC Managed Care – PPO | Source: Ambulatory Visit | Attending: Obstetrics and Gynecology | Admitting: Obstetrics and Gynecology

## 2022-09-26 ENCOUNTER — Ambulatory Visit (INDEPENDENT_AMBULATORY_CARE_PROVIDER_SITE_OTHER): Payer: BC Managed Care – PPO | Admitting: Obstetrics and Gynecology

## 2022-09-26 ENCOUNTER — Encounter: Payer: Self-pay | Admitting: Obstetrics and Gynecology

## 2022-09-26 VITALS — BP 100/64 | Ht 63.0 in | Wt 143.0 lb

## 2022-09-26 DIAGNOSIS — Z124 Encounter for screening for malignant neoplasm of cervix: Secondary | ICD-10-CM | POA: Diagnosis not present

## 2022-09-26 DIAGNOSIS — Z3041 Encounter for surveillance of contraceptive pills: Secondary | ICD-10-CM

## 2022-09-26 DIAGNOSIS — Z01419 Encounter for gynecological examination (general) (routine) without abnormal findings: Secondary | ICD-10-CM

## 2022-09-26 DIAGNOSIS — Z1151 Encounter for screening for human papillomavirus (HPV): Secondary | ICD-10-CM | POA: Insufficient documentation

## 2022-09-26 MED ORDER — NORETHIN ACE-ETH ESTRAD-FE 1-20 MG-MCG PO TABS: ORAL_TABLET | ORAL | 4 refills | Status: DC

## 2022-09-26 NOTE — Patient Instructions (Signed)
I value your feedback and you entrusting us with your care. If you get a [facility] patient survey, I would appreciate you taking the time to let us know about your experience today. Thank you! ? ? ?

## 2022-09-29 LAB — CYTOLOGY - PAP
Comment: NEGATIVE
Diagnosis: UNDETERMINED — AB
High risk HPV: NEGATIVE

## 2022-10-18 ENCOUNTER — Ambulatory Visit: Payer: BC Managed Care – PPO | Admitting: Podiatry

## 2022-11-03 ENCOUNTER — Ambulatory Visit: Payer: BC Managed Care – PPO | Admitting: Podiatry

## 2022-11-03 DIAGNOSIS — L600 Ingrowing nail: Secondary | ICD-10-CM

## 2022-11-03 NOTE — Patient Instructions (Signed)

## 2022-11-03 NOTE — Progress Notes (Signed)
  Subjective:  Patient ID: Roberta Coleman, female    DOB: [birth year],  MRN: [id number]  Chief Complaint  Patient presents with   Nail Problem    Pt stated that she struggles with ingrown nails     37 y.o. female presents with the above complaint.  Patient presents with bilateral hallux lateral border ingrown painful calluses progressive gotten worse worse with ambulation worse with pressure pain scale 7 out of 10 dull achy in nature she would like for me removed she is not able to do her self.   Review of Systems: Negative except as noted in the HPI. Denies N/V/F/Ch.  Past Medical History:  Diagnosis Date   Allergy    High cholesterol    History of blood transfusion 2017   symptomatic anemia after incomplete AB/ D&C   Hyperlipidemia    Obesity     Current Outpatient Medications:    atorvastatin (LIPITOR) 10 MG tablet, Take 10 mg by mouth at bedtime., Disp: , Rfl:    norethindrone-ethinyl estradiol-FE (JUNEL FE 1/20) 1-20 MG-MCG tablet, TAKE 1 TABLET BY MOUTH DAILY CONTINUOUS DOSING, Disp: 84 tablet, Rfl: 4   sertraline (ZOLOFT) 50 MG tablet, Take 1 tablet (50 mg total) by mouth at bedtime., Disp: 30 tablet, Rfl: 1  Social History   Tobacco Use  Smoking Status Never  Smokeless Tobacco Never    Allergies  Allergen Reactions   Oxycodone Hcl Itching   Objective:  There were no vitals filed for this visit. There is no height or weight on file to calculate BMI. Constitutional Well developed. Well nourished.  Vascular Dorsalis pedis pulses palpable bilaterally. Posterior tibial pulses palpable bilaterally. Capillary refill normal to all digits.  No cyanosis or clubbing noted. Pedal hair growth normal.  Neurologic Normal speech. Oriented to person, place, and time. Epicritic sensation to light touch grossly present bilaterally.  Dermatologic Painful ingrowing nail at lateral nail borders of the hallux nail bilaterally. No other open wounds. No skin lesions.   Orthopedic: Normal joint ROM without pain or crepitus bilaterally. No visible deformities. No bony tenderness.   Radiographs: None Assessment:   1. Ingrown toenail of right foot   2. Ingrown left big toenail    Plan:  Patient was evaluated and treated and all questions answered.  Ingrown Nail, bilaterally -Patient elects to proceed with minor surgery to remove ingrown toenail removal today. Consent reviewed and signed by patient. -Ingrown nail excised. See procedure note. -Educated on post-procedure care including soaking. Written instructions provided and reviewed. -Patient to follow up in 2 weeks for nail check.  Procedure: Excision of Ingrown Toenail Location: Bilateral 1st toe lateral nail borders. Anesthesia: Lidocaine 1% plain; 1.5 mL and Marcaine 0.5% plain; 1.5 mL, digital block. Skin Prep: Betadine. Dressing: Silvadene; telfa; dry, sterile, compression dressing. Technique: Following skin prep, the toe was exsanguinated and a tourniquet was secured at the base of the toe. The affected nail border was freed, split with a nail splitter, and excised. Chemical matrixectomy was then performed with phenol and irrigated out with alcohol. The tourniquet was then removed and sterile dressing applied. Disposition: Patient tolerated procedure well. Patient to return in 2 weeks for follow-up.   No follow-ups on file.

## 2022-11-27 DIAGNOSIS — J029 Acute pharyngitis, unspecified: Secondary | ICD-10-CM | POA: Diagnosis not present

## 2022-12-19 ENCOUNTER — Telehealth: Payer: Self-pay | Admitting: Podiatry

## 2022-12-19 MED ORDER — DOXYCYCLINE HYCLATE 100 MG PO TABS: 100.0000 mg | ORAL_TABLET | Freq: Two times a day (BID) | ORAL | 0 refills | Status: DC

## 2022-12-19 NOTE — Telephone Encounter (Signed)
Patient called stating she had ingrown toe nail removed here a couple weeks ago. Now pt has some drainage she thinks its infected. Scheduled pt appt next Tuesday she would RX sent to [facility] pharmacy.

## 2022-12-26 ENCOUNTER — Ambulatory Visit (INDEPENDENT_AMBULATORY_CARE_PROVIDER_SITE_OTHER): Payer: BC Managed Care – PPO | Admitting: Podiatry

## 2022-12-26 DIAGNOSIS — L600 Ingrowing nail: Secondary | ICD-10-CM | POA: Diagnosis not present

## 2022-12-26 NOTE — Progress Notes (Signed)
  Subjective:  Patient ID: Roberta Coleman, female    DOB: 1985-10-10,  MRN: [id number]  Chief Complaint  Patient presents with   Nail Problem    37 y.o. female presents with the above complaint.  Patient presents with bilateral paronychia with a history of ingrown removal to the lateral side.  Patient is currently on doxycycline she states that she still has few days left.  She wanted discuss treatment options.  She noticed a little bit of drainage coming out.  She has not been putting anything on it.   Review of Systems: Negative except as noted in the HPI. Denies N/V/F/Ch.  Past Medical History:  Diagnosis Date   Allergy    High cholesterol    History of blood transfusion 2017   symptomatic anemia after incomplete AB/ D&C   Hyperlipidemia    Obesity     Current Outpatient Medications:    doxycycline (VIBRA-TABS) 100 MG tablet, Take 1 tablet (100 mg total) by mouth 2 (two) times daily., Disp: 20 tablet, Rfl: 0   atorvastatin (LIPITOR) 10 MG tablet, Take 10 mg by mouth at bedtime., Disp: , Rfl:    norethindrone-ethinyl estradiol-FE (JUNEL FE 1/20) 1-20 MG-MCG tablet, TAKE 1 TABLET BY MOUTH DAILY CONTINUOUS DOSING, Disp: 84 tablet, Rfl: 4   sertraline (ZOLOFT) 50 MG tablet, Take 1 tablet (50 mg total) by mouth at bedtime., Disp: 30 tablet, Rfl: 1  Social History   Tobacco Use  Smoking Status Never  Smokeless Tobacco Never    Allergies  Allergen Reactions   Oxycodone Hcl Itching   Objective:  There were no vitals filed for this visit. There is no height or weight on file to calculate BMI. Constitutional Well developed. Well nourished.  Vascular Dorsalis pedis pulses palpable bilaterally. Posterior tibial pulses palpable bilaterally. Capillary refill normal to all digits.  No cyanosis or clubbing noted. Pedal hair growth normal.  Neurologic Normal speech. Oriented to person, place, and time. Epicritic sensation to light touch grossly present bilaterally.   Dermatologic Bilateral paronychia with lateral border ingrown.  No purulent drainage noted.  Mild paronychia still noticed  Orthopedic: Normal joint ROM without pain or crepitus bilaterally. No visible deformities. No bony tenderness.   Radiographs: None Assessment:   1. Ingrown toenail of right foot   2. Ingrown left big toenail    Plan:  Patient was evaluated and treated and all questions answered.  Bilateral hallux paronychia with a history of ingrown removal -All questions and concerns were discussed with the patient extensive detail.  I encouraged her to stop doing the Epsom salt soaks.  She will do Betadine wet-to-dry dressing to help heal the wound. -Complete her antibiotics doxycycline.  She states understanding  No follow-ups on file.

## 2023-06-19 ENCOUNTER — Ambulatory Visit (INDEPENDENT_AMBULATORY_CARE_PROVIDER_SITE_OTHER): Payer: BC Managed Care – PPO | Admitting: Podiatry

## 2023-06-19 ENCOUNTER — Encounter: Payer: Self-pay | Admitting: Podiatry

## 2023-06-19 VITALS — Ht 63.0 in | Wt 143.0 lb

## 2023-06-19 DIAGNOSIS — L600 Ingrowing nail: Secondary | ICD-10-CM | POA: Diagnosis not present

## 2023-06-19 NOTE — Progress Notes (Signed)
Subjective:  Patient ID: Roberta Coleman, female    DOB: 10-22-1985,  MRN: [id number]  Chief Complaint  Patient presents with   Ingrown Toenail    Pt is here due to ingrown on right greater toenail, pt states she had a ingrown remove from same toe 8 months ago, states she believes the ingrown is coming back notices drainage to area and pain.    36 y.o. female presents with the above complaint. Patient presents for right hallux lateral border ingrown painful to touch is progressive gotten worse worse with ambulation worse with pressure she would like to have removed.  She states started growing back again.  Is hurting with ambulation only with pressure denies any other treatment pain scale 7 out of 10 dull aching nature   Review of Systems: Negative except as noted in the HPI. Denies N/V/F/Ch.  Past Medical History:  Diagnosis Date   Allergy    High cholesterol    History of blood transfusion 2017   symptomatic anemia after incomplete AB/ D&C   Hyperlipidemia    Obesity     Current Outpatient Medications:    atorvastatin (LIPITOR) 10 MG tablet, Take 10 mg by mouth at bedtime., Disp: , Rfl:    doxycycline (VIBRA-TABS) 100 MG tablet, Take 1 tablet (100 mg total) by mouth 2 (two) times daily., Disp: 20 tablet, Rfl: 0   norethindrone-ethinyl estradiol-FE (JUNEL FE 1/20) 1-20 MG-MCG tablet, TAKE 1 TABLET BY MOUTH DAILY CONTINUOUS DOSING, Disp: 84 tablet, Rfl: 4   sertraline (ZOLOFT) 50 MG tablet, Take 1 tablet (50 mg total) by mouth at bedtime., Disp: 30 tablet, Rfl: 1  Social History   Tobacco Use  Smoking Status Never  Smokeless Tobacco Never    Allergies  Allergen Reactions   Oxycodone Hcl Itching   Objective:  There were no vitals filed for this visit. Body mass index is 25.33 kg/m. Constitutional Well developed. Well nourished.  Vascular Dorsalis pedis pulses palpable bilaterally. Posterior tibial pulses palpable bilaterally. Capillary refill normal to all digits.   No cyanosis or clubbing noted. Pedal hair growth normal.  Neurologic Normal speech. Oriented to person, place, and time. Epicritic sensation to light touch grossly present bilaterally.  Dermatologic Painful ingrowing nail at lateral nail borders of the hallux nail right. No other open wounds. No skin lesions.  Orthopedic: Normal joint ROM without pain or crepitus bilaterally. No visible deformities. No bony tenderness.   Radiographs: None Assessment:   1. Ingrown toenail of right foot    Plan:  Patient was evaluated and treated and all questions answered.  Ingrown Nail, right~recurrence -Patient elects to proceed with minor surgery to remove ingrown toenail removal today. Consent reviewed and signed by patient. -Ingrown nail excised. See procedure note. -Educated on post-procedure care including soaking. Written instructions provided and reviewed. -Patient to follow up in 2 weeks for nail check.  Procedure: Excision of Ingrown Toenail Location: Right 1st toe lateral nail borders. Anesthesia: Lidocaine 1% plain; 1.5 mL and Marcaine 0.5% plain; 1.5 mL, digital block. Skin Prep: Betadine. Dressing: Silvadene; telfa; dry, sterile, compression dressing. Technique: Following skin prep, the toe was exsanguinated and a tourniquet was secured at the base of the toe. The affected nail border was freed, split with a nail splitter, and excised. Chemical matrixectomy was then performed with phenol and irrigated out with alcohol. The tourniquet was then removed and sterile dressing applied. Disposition: Patient tolerated procedure well. Patient to return in 2 weeks for follow-up.   No follow-ups on file.

## 2023-09-04 DIAGNOSIS — D2262 Melanocytic nevi of left upper limb, including shoulder: Secondary | ICD-10-CM | POA: Diagnosis not present

## 2023-09-04 DIAGNOSIS — D225 Melanocytic nevi of trunk: Secondary | ICD-10-CM | POA: Diagnosis not present

## 2023-09-04 DIAGNOSIS — L578 Other skin changes due to chronic exposure to nonionizing radiation: Secondary | ICD-10-CM | POA: Diagnosis not present

## 2023-09-04 DIAGNOSIS — L814 Other melanin hyperpigmentation: Secondary | ICD-10-CM | POA: Diagnosis not present

## 2023-09-20 ENCOUNTER — Encounter: Payer: Self-pay | Admitting: Obstetrics and Gynecology

## 2023-10-02 ENCOUNTER — Ambulatory Visit (INDEPENDENT_AMBULATORY_CARE_PROVIDER_SITE_OTHER): Admitting: Certified Nurse Midwife

## 2023-10-02 ENCOUNTER — Encounter: Payer: Self-pay | Admitting: Certified Nurse Midwife

## 2023-10-02 ENCOUNTER — Other Ambulatory Visit (HOSPITAL_COMMUNITY)
Admission: RE | Admit: 2023-10-02 | Discharge: 2023-10-02 | Disposition: A | Discharge status: Home / Self Care | Source: Ambulatory Visit | Attending: Certified Nurse Midwife | Admitting: Certified Nurse Midwife

## 2023-10-02 VITALS — BP 96/63 | HR 103 | Ht 63.0 in | Wt 128.1 lb

## 2023-10-02 DIAGNOSIS — Z1151 Encounter for screening for human papillomavirus (HPV): Secondary | ICD-10-CM

## 2023-10-02 DIAGNOSIS — Z124 Encounter for screening for malignant neoplasm of cervix: Secondary | ICD-10-CM | POA: Diagnosis not present

## 2023-10-02 DIAGNOSIS — Z3041 Encounter for surveillance of contraceptive pills: Secondary | ICD-10-CM

## 2023-10-02 DIAGNOSIS — Z01419 Encounter for gynecological examination (general) (routine) without abnormal findings: Secondary | ICD-10-CM

## 2023-10-02 NOTE — Patient Instructions (Signed)

## 2023-10-02 NOTE — Progress Notes (Unsigned)
   ANNUAL EXAM Patient name: Roberta Coleman MRN [id number]  Date of birth: [birth year] Chief Complaint:   Annual Exam  History of Present Illness:   Roberta Coleman is a 38 y.o. [id number] {race:25618} female being seen today for a routine annual exam.  Current complaints: ***  Patient's last menstrual period was 07/30/2023 (approximate).   Upstream - 10/02/23 1335       Pregnancy Intention Screening   Does the patient want to become pregnant in the next year? No    Does the patient's partner want to become pregnant in the next year? No    Would the patient like to discuss contraceptive options today? No      Contraception Wrap Up   Current Method Oral Contraceptive    End Method Oral Contraceptive    Contraception Counseling Provided No            The pregnancy intention screening data noted above was reviewed. Potential methods of contraception were discussed. The patient elected to proceed with Oral Contraceptive.      Component Value Date/Time   DIAGPAP (A) 09/26/2022 1026    - Atypical squamous cells of undetermined significance (ASC-US)   DIAGPAP  04/04/2018 0000    NEGATIVE FOR INTRAEPITHELIAL LESIONS OR MALIGNANCY.   HPVHIGH Negative 09/26/2022 1026   ADEQPAP  09/26/2022 1026    Satisfactory for evaluation; transformation zone component PRESENT.   ADEQPAP  04/04/2018 0000    Satisfactory for evaluation  endocervical/transformation zone component PRESENT.       Last pap ***. Results were: {Pap findings:25134}. H/O abnormal pap: {yes/yes***/no:23866} Last mammogram: ***. Results were: {normal, abnormal, n/a:23837}. Family h/o breast cancer: {yes***/no:23838} Last colonoscopy: ***. Results were: {normal, abnormal, n/a:23837}. Family h/o colorectal cancer: {yes***/no:23838}      No data to display                No data to display            Review of Systems:   Pertinent items are noted in HPI Denies any headaches, blurred vision, fatigue,  shortness of breath, chest pain, abdominal pain, abnormal vaginal discharge/itching/odor/irritation, problems with periods, bowel movements, urination, or intercourse unless otherwise stated above. Pertinent History Reviewed:  Reviewed past medical,surgical, social and family history.  Reviewed problem list, medications and allergies. Physical Assessment:   Vitals:   10/02/23 1334  BP: 96/63  Pulse: (!) 103  Weight: 128 lb 1.6 oz (58.1 kg)  Height: 5\' 3"  (1.6 m)  Body mass index is 22.69 kg/m.       Physical Exam   No results found for this or any previous visit (from the past 24 hours).  Assessment & Plan:  1. Well woman exam (Primary)  2. Cervical cancer screening  3. Encounter for screening for human papillomavirus (HPV)   Mammogram: {Mammo f/u:25212}, or sooner if problems Colonoscopy: {TCS f/u:25213}, or sooner if problems  No orders of the defined types were placed in this encounter.   Meds: No orders of the defined types were placed in this encounter.   Follow-up: Return in 1 year (on 10/01/2024) for Annual exam.  Dominica Severin, CNM 10/02/2023 1:40 PM

## 2023-10-03 MED ORDER — JUNEL FE 1/20 1-20 MG-MCG PO TABS: ORAL_TABLET | ORAL | 4 refills | Status: AC

## 2023-10-09 LAB — CYTOLOGY - PAP
Comment: NEGATIVE
Diagnosis: NEGATIVE
High risk HPV: NEGATIVE

## 2023-10-10 ENCOUNTER — Encounter: Payer: Self-pay | Admitting: Certified Nurse Midwife

## 2024-02-22 DIAGNOSIS — N3000 Acute cystitis without hematuria: Secondary | ICD-10-CM | POA: Diagnosis not present

## 2024-03-14 ENCOUNTER — Telehealth: Payer: Self-pay

## 2024-03-14 NOTE — Telephone Encounter (Signed)
 Pt walked in requesting [facility] immunization record in pts maiden name Roberta Coleman. Pt saw Dr Jenetta 2012. Rosina RN team lead said OK to print for pt. Copy of [place]  immunization registry given to pt. Nothing further needed.

## 2024-03-21 ENCOUNTER — Telehealth: Payer: Self-pay | Admitting: *Deleted

## 2024-03-21 NOTE — Telephone Encounter (Signed)
 I called patient and she stated she would give a call back.

## 2024-03-21 NOTE — Telephone Encounter (Signed)
 No vaccines will be given unless pt is an established pt here. Pt will need an establish care appt with any available provider (last OV was over 10 yrs ago). Can also recommend pt go to health dpt or a pharmacy to inquire about vaccines

## 2024-03-21 NOTE — Telephone Encounter (Signed)
 Copied from CRM #[id number]. Topic: Appointments - Scheduling Inquiry for Clinic >> Mar 21, 2024  1:36 PM Ahlexyia S wrote: Reason for CRM: Pt is wanting to have MMR vaccine and Tb test done as well but she hasn't been seen in office in over 5 years. Informed pt that she will need to establish care with a provider in office but let her know I will send message to clinic for inquiry.

## 2024-03-24 DIAGNOSIS — Z111 Encounter for screening for respiratory tuberculosis: Secondary | ICD-10-CM | POA: Diagnosis not present

## 2024-03-26 DIAGNOSIS — Z111 Encounter for screening for respiratory tuberculosis: Secondary | ICD-10-CM | POA: Diagnosis not present

## 2024-04-11 DIAGNOSIS — H6982 Other specified disorders of Eustachian tube, left ear: Secondary | ICD-10-CM | POA: Diagnosis not present

## 2024-04-11 DIAGNOSIS — H93293 Other abnormal auditory perceptions, bilateral: Secondary | ICD-10-CM | POA: Diagnosis not present

## 2024-06-09 DIAGNOSIS — H6982 Other specified disorders of Eustachian tube, left ear: Secondary | ICD-10-CM | POA: Diagnosis not present
